# Patient Record
Sex: Male | Born: 1965 | Race: Black or African American | Hispanic: No | Smoking: Former smoker
Health system: Southern US, Community
[De-identification: ages and names within clinical notes are randomized; demographics above are authoritative.]

## PROBLEM LIST (undated history)

## (undated) DIAGNOSIS — E119 Type 2 diabetes mellitus without complications: Secondary | ICD-10-CM

## (undated) DIAGNOSIS — Z0000 Encounter for general adult medical examination without abnormal findings: Principal | ICD-10-CM

## (undated) DIAGNOSIS — R519 Headache, unspecified: Secondary | ICD-10-CM

## (undated) DIAGNOSIS — K7689 Other specified diseases of liver: Secondary | ICD-10-CM

## (undated) DIAGNOSIS — E042 Nontoxic multinodular goiter: Secondary | ICD-10-CM

## (undated) DIAGNOSIS — H269 Unspecified cataract: Secondary | ICD-10-CM

## (undated) DIAGNOSIS — K769 Liver disease, unspecified: Secondary | ICD-10-CM

## (undated) DIAGNOSIS — E785 Hyperlipidemia, unspecified: Secondary | ICD-10-CM

## (undated) HISTORY — DX: Nontoxic multinodular goiter: E04.2

## (undated) HISTORY — DX: Encounter for general adult medical examination without abnormal findings: Z00.00

## (undated) HISTORY — DX: Unspecified cataract: H26.9

## (undated) HISTORY — PX: WISDOM TOOTH EXTRACTION: SHX21

## (undated) HISTORY — DX: Other specified diseases of liver: K76.89

## (undated) HISTORY — DX: Hyperlipidemia, unspecified: E78.5

## (undated) HISTORY — DX: Type 2 diabetes mellitus without complications: E11.9

## (undated) HISTORY — PX: EYE SURGERY: SHX253

## (undated) HISTORY — DX: Liver disease, unspecified: K76.9

---

## 2004-12-17 ENCOUNTER — Ambulatory Visit: Payer: Self-pay | Admitting: Internal Medicine

## 2005-05-14 ENCOUNTER — Ambulatory Visit: Payer: Self-pay | Admitting: Internal Medicine

## 2005-05-21 ENCOUNTER — Ambulatory Visit: Payer: Self-pay

## 2006-01-06 ENCOUNTER — Ambulatory Visit: Payer: Self-pay | Admitting: Internal Medicine

## 2007-03-24 ENCOUNTER — Ambulatory Visit: Payer: Self-pay | Admitting: Internal Medicine

## 2007-03-24 LAB — CONVERTED CEMR LAB
Cholesterol: 196 mg/dL (ref 0–200)
HDL: 52.3 mg/dL (ref >39.0)
Microalb Creat Ratio: 40.6 mg/g — ABNORMAL HIGH (ref 0.0–30.0)
Potassium: 3.6 meq/L (ref 3.5–5.1)
VLDL: 11 mg/dL (ref 0–40)

## 2007-05-01 ENCOUNTER — Ambulatory Visit: Payer: Self-pay | Admitting: Endocrinology

## 2007-05-01 LAB — CONVERTED CEMR LAB: TSH: 1.39 microintl units/mL (ref 0.35–5.50)

## 2007-06-01 ENCOUNTER — Ambulatory Visit: Payer: Self-pay | Admitting: Endocrinology

## 2007-06-06 ENCOUNTER — Encounter: Admission: RE | Admit: 2007-06-06 | Discharge: 2007-06-06 | Payer: Self-pay | Admitting: Endocrinology

## 2007-07-24 ENCOUNTER — Ambulatory Visit: Payer: Self-pay | Admitting: Endocrinology

## 2007-09-13 ENCOUNTER — Encounter: Payer: Self-pay | Admitting: *Deleted

## 2007-09-13 DIAGNOSIS — E119 Type 2 diabetes mellitus without complications: Secondary | ICD-10-CM

## 2007-09-13 HISTORY — DX: Type 2 diabetes mellitus without complications: E11.9

## 2007-09-22 ENCOUNTER — Encounter: Payer: Self-pay | Admitting: Endocrinology

## 2007-09-22 ENCOUNTER — Ambulatory Visit: Payer: Self-pay | Admitting: Endocrinology

## 2007-09-22 DIAGNOSIS — K769 Liver disease, unspecified: Secondary | ICD-10-CM | POA: Insufficient documentation

## 2007-09-22 DIAGNOSIS — E042 Nontoxic multinodular goiter: Secondary | ICD-10-CM

## 2007-09-22 HISTORY — DX: Liver disease, unspecified: K76.9

## 2007-09-22 HISTORY — DX: Nontoxic multinodular goiter: E04.2

## 2007-09-22 LAB — CONVERTED CEMR LAB
ALT: 28 units/L (ref 0–53)
AST: 27 units/L (ref 0–37)
Total Bilirubin: 0.8 mg/dL (ref 0.3–1.2)
Total Protein: 7.2 g/dL (ref 6.0–8.3)

## 2008-04-12 ENCOUNTER — Ambulatory Visit: Payer: Self-pay | Admitting: Endocrinology

## 2008-04-12 DIAGNOSIS — B351 Tinea unguium: Secondary | ICD-10-CM | POA: Insufficient documentation

## 2008-04-12 DIAGNOSIS — R609 Edema, unspecified: Secondary | ICD-10-CM

## 2008-04-12 LAB — CONVERTED CEMR LAB
AST: 20 units/L (ref 0–37)
Albumin: 4 g/dL (ref 3.5–5.2)
Bilirubin, Direct: 0.1 mg/dL (ref 0.0–0.3)
Hgb A1c MFr Bld: 8.3 % — ABNORMAL HIGH (ref 4.6–6.0)
Total Bilirubin: 0.8 mg/dL (ref 0.3–1.2)
Total Protein: 7.3 g/dL (ref 6.0–8.3)

## 2008-10-29 ENCOUNTER — Ambulatory Visit: Payer: Self-pay | Admitting: Endocrinology

## 2008-10-29 DIAGNOSIS — K7689 Other specified diseases of liver: Secondary | ICD-10-CM | POA: Insufficient documentation

## 2008-10-29 HISTORY — DX: Other specified diseases of liver: K76.89

## 2008-10-29 LAB — CONVERTED CEMR LAB
ALT: 63 units/L — ABNORMAL HIGH (ref 0–53)
AST: 54 units/L — ABNORMAL HIGH (ref 0–37)
Bilirubin, Direct: 0.1 mg/dL (ref 0.0–0.3)
Hgb A1c MFr Bld: 10.2 % — ABNORMAL HIGH (ref 4.6–6.0)
Total Bilirubin: 1.3 mg/dL — ABNORMAL HIGH (ref 0.3–1.2)

## 2009-01-27 ENCOUNTER — Ambulatory Visit: Payer: Self-pay | Admitting: Endocrinology

## 2009-01-27 DIAGNOSIS — R071 Chest pain on breathing: Secondary | ICD-10-CM | POA: Insufficient documentation

## 2009-01-27 LAB — CONVERTED CEMR LAB
AST: 20 units/L (ref 0–37)
Albumin: 3.7 g/dL (ref 3.5–5.2)
Bilirubin, Direct: 0.1 mg/dL (ref 0.0–0.3)

## 2009-12-11 ENCOUNTER — Telehealth (INDEPENDENT_AMBULATORY_CARE_PROVIDER_SITE_OTHER): Payer: Self-pay | Admitting: *Deleted

## 2009-12-26 ENCOUNTER — Ambulatory Visit: Payer: Self-pay | Admitting: Endocrinology

## 2010-01-07 ENCOUNTER — Telehealth: Payer: Self-pay | Admitting: Endocrinology

## 2010-02-15 ENCOUNTER — Emergency Department (HOSPITAL_COMMUNITY): Admission: EM | Admit: 2010-02-15 | Discharge: 2010-02-15 | Payer: Self-pay | Admitting: Emergency Medicine

## 2010-02-26 ENCOUNTER — Encounter: Admission: RE | Admit: 2010-02-26 | Discharge: 2010-02-26 | Payer: Self-pay | Admitting: Family Medicine

## 2010-04-03 ENCOUNTER — Telehealth: Payer: Self-pay | Admitting: Endocrinology

## 2010-08-25 ENCOUNTER — Encounter: Payer: Self-pay | Admitting: Endocrinology

## 2011-01-15 ENCOUNTER — Encounter: Payer: Self-pay | Admitting: Endocrinology

## 2011-01-15 ENCOUNTER — Other Ambulatory Visit: Payer: Self-pay | Admitting: Endocrinology

## 2011-01-15 ENCOUNTER — Ambulatory Visit
Admission: RE | Admit: 2011-01-15 | Discharge: 2011-01-15 | Payer: Self-pay | Source: Home / Self Care | Attending: Endocrinology | Admitting: Endocrinology

## 2011-01-15 DIAGNOSIS — E7800 Pure hypercholesterolemia, unspecified: Secondary | ICD-10-CM | POA: Insufficient documentation

## 2011-01-15 DIAGNOSIS — M25539 Pain in unspecified wrist: Secondary | ICD-10-CM | POA: Insufficient documentation

## 2011-01-15 LAB — HEPATIC FUNCTION PANEL
ALT: 22 U/L (ref 0–53)
AST: 23 U/L (ref 0–37)
Alkaline Phosphatase: 68 U/L (ref 39–117)

## 2011-01-15 LAB — BASIC METABOLIC PANEL
BUN: 13 mg/dL (ref 6–23)
CO2: 30 mEq/L (ref 19–32)
Creatinine, Ser: 0.8 mg/dL (ref 0.4–1.5)
GFR: 134.12 mL/min (ref >60.00)

## 2011-01-15 LAB — LIPID PANEL
Cholesterol: 223 mg/dL — ABNORMAL HIGH (ref 0–200)
HDL: 62.8 mg/dL (ref >39.00)
Total CHOL/HDL Ratio: 4
Triglycerides: 58 mg/dL (ref 0.0–149.0)

## 2011-01-15 LAB — SEDIMENTATION RATE: Sed Rate: 13 mm/hr (ref 0–22)

## 2011-01-15 LAB — LDL CHOLESTEROL, DIRECT: Direct LDL: 153.5 mg/dL

## 2011-01-15 LAB — MICROALBUMIN / CREATININE URINE RATIO: Microalb Creat Ratio: 7.1 mg/g (ref 0.0–30.0)

## 2011-01-19 NOTE — Progress Notes (Signed)
Summary: Meter  Phone Note Call from Patient Call back at Home Phone [phone]   Caller: Patient Call For: Dr. Romero Belling Reason for Call: Talk to Doctor Summary of Call: Patient came into the office to let Dr. Everardo All know what type of meter he has. He said Dr. Everardo All wanted to know for insurance purposes. He has a Bionime Rightest [id number].  Initial call taken by: Irma Newness,  January 07, 2010 11:38 AM  Follow-up for Phone Call        please call patient: i sent rx for strips Follow-up by: Minus Breeding MD,  January 11, 2010 1:20 PM  Additional Follow-up for Phone Call Additional follow up Details #1::        left message on machine for pt to return my call  Additional Follow-up by: Margaret Pyle, CMA,  January 12, 2010 8:08 AM    Additional Follow-up for Phone Call Additional follow up Details #2::    left message on machine informing pt Follow-up by: Margaret Pyle, CMA,  January 12, 2010 2:33 PM  New/Updated Medications: RIGHTEST GS300 BLOOD GLUCOSE  STRP (GLUCOSE BLOOD) two times a day, and lancets 250.00 Prescriptions: RIGHTEST GS300 BLOOD GLUCOSE  STRP (GLUCOSE BLOOD) two times a day, and lancets 250.00  #100 x 11   Entered and Authorized by:   Minus Breeding MD   Signed by:   Minus Breeding MD on 01/11/2010   Method used:   Electronically to        [id number]* (retail)       [address] [place], [place]  [address]       Ph: [phone]       Fax: [phone]   RxID:   [id number]

## 2011-01-19 NOTE — Medication Information (Signed)
Summary: Nonadherence/[facility]  Nonadherence/[facility]   Imported By: Lester Webb 08/31/2010 09:41:23  _____________________________________________________________________  External Attachment:    Type:   Image     Comment:   External Document

## 2011-01-19 NOTE — Progress Notes (Signed)
Summary: ov needed  Phone Note Outgoing Call   Summary of Call: Left a message for pt to return my call. MD states Follow up is overdue. Initial call taken by: Josph Macho RMA,  April 03, 2010 9:17 AM  Follow-up for Phone Call        left message on machine to call back to office. Follow-up by: Lucious Groves,  Apr 21, 2010 2:54 PM  Additional Follow-up for Phone Call Additional follow up Details #1::        pt informed via VM Additional Follow-up by: Margaret Pyle, CMA,  Apr 24, 2010 11:14 AM

## 2011-01-19 NOTE — Assessment & Plan Note (Signed)
Summary: elev sugar/cd   Vital Signs:  Patient profile:   45 year old male Height:      72 inches (182.88 cm) Weight:      204.25 pounds (92.84 kg) BMI:     27.80 O2 Sat:      97 % on Room air Temp:     97.3 degrees F (36.28 degrees C) oral Pulse rate:   65 / minute BP sitting:   118 / 86  (left arm) Cuff size:   large  Vitals Entered By: Josph Macho CMA (December 26, 2009 3:43 PM)  O2 Flow:  Room air CC: Elevated Sugar- pt states he has been out of Glimepiride X2weeks/ pt states he is no longer taking Januvia/ pt states he is not aware of being allergic to Actos?/CF Is Patient Diabetic? Yes   Primary Provider:  hopper  CC:  Elevated Sugar- pt states he has been out of Glimepiride X2weeks/ pt states he is no longer taking Januvia/ pt states he is not aware of being allergic to Actos?/CF.  History of Present Illness: see above.  pt says he has not recently been on [place], but ran out of the amaryl.  no cbg record, but states cbg's are 200's.  when he was on the amaryl, cbg's were approx 140.  Current Medications (verified): 1)  Metformin Hcl 1000 Mg  Tabs (Metformin Hcl) .... Take 1 By Mouth Two Times A Day Qd 2)  Amaryl 2 Mg  Tabs (Glimepiride) .... Bid 3)  Januvia 100 Mg Tabs (Sitagliptin Phosphate) .... Qam 4)  Actos 45 Mg Tabs (Pioglitazone Hcl) .Marland Kitchen.. 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    Physical Exam  General:  normal appearance.   Pulses:  dorsalis pedis intact bilat. Extremities:  no deformity.  no ulcer on the feet.  feet are of normal color and temp.  no  edema.  there is onychomycosis on the right great toenail, and dry skin on the feet in general.  Neurologic:  sensation is intact to touch on the feet.    Impression & Recommendations:  Problem # 1:  DIABETES MELLITUS, TYPE II (ICD-250.00) needs increased rx  Medications Added to Medication List This Visit: 1)  Actos 45 Mg Tabs (Pioglitazone hcl) .Marland Kitchen.. 1 tab daily  Other Orders: Est. Patient Level III (41660)  Patient Instructions: 1)  resume amaryl 2)  continue actos and metformin. 3)  check your blood sugar 2 times a day.  vary the time of day when you check, between before the 3 meals, and at bedtime.  also check if you have symptoms of your blood sugar being too high or too low.  please keep a record of the readings and bring it to your next appointment here.  please call us sooner if you are having low blood sugar episodes. 4)  please call us with the brand of meter you use, so we can send in rx for your strips, so your insurance will pay. 5)  return 6 weeks. 6)  come in next week for physical labs, a1c 250.00, and microalb 250.00. 7)  physical with dr hopper soon. Prescriptions: ACTOS 45 MG TABS (PIOGLITAZONE HCL)  1 tab daily  #30 x 11   Entered and Authorized by:   Minus Breeding MD   Signed by:   Minus Breeding MD on 12/26/2009   Method used:   Electronically to        [id number]* (retail)       [address] [place], [place]  [address]       Ph: [phone]       Fax: [phone]   RxID:   [id number] AMARYL 2 MG  TABS (GLIMEPIRIDE) bid  #60.0 Each x 11   Entered and Authorized by:   Minus Breeding MD   Signed by:   Minus Breeding MD on 12/26/2009   Method used:   Electronically to        [facility] #[facility]* (retail)       [address] [place], [place]  [address]       Ph: [phone]       Fax: [phone]   RxID:   [id number] METFORMIN HCL 1000 MG  TABS (METFORMIN HCL) take 1 by mouth two times a day qd  #60.0  Each x 11   Entered and Authorized by:   Minus Breeding MD   Signed by:   Minus Breeding MD on 12/26/2009   Method used:   Electronically to        [facility]* (retail)       [address] [place], [place]  [address]       Ph: [phone]       Fax: [phone]   RxID:   [id number]

## 2011-01-27 NOTE — Assessment & Plan Note (Signed)
Summary: f/u on diabetes/#/cd   Vital Signs:  Patient profile:   45 year old male Height:      72 inches (182.88 cm) Weight:      203.50 pounds (92.50 kg) BMI:     27.70 O2 Sat:      96 % on Room air Temp:     98.0 degrees F (36.67 degrees C) oral Pulse rate:   74 / minute Pulse rhythm:   regular BP sitting:   100 / 74  (left arm) Cuff size:   large  Vitals Entered By: Brenton Grills CMA Duncan Dull) (January 15, 2011 8:06 AM)  O2 Flow:  Room air CC: Follow up on DM/refills on meds/pt is no longer taking Actos/aj Is Patient Diabetic? Yes   Primary Provider:  hopper  CC:  Follow up on DM/refills on meds/pt is no longer taking Actos/aj.  History of Present Illness: pt states few mos of moderate pain at both wrists, but no assoc numbness. no cbg record, but states cbg's vary widely.  he ran out of medication approx 1 week ago.    Current Medications (verified): 1)  Metformin Hcl 1000 Mg  Tabs (Metformin Hcl) .... Take 1 By Mouth Two Times A Day Qd 2)  Amaryl 2 Mg  Tabs (Glimepiride) .... Bid 3)  Actos 45 Mg Tabs (Pioglitazone Hcl) .Marland Kitchen.. 1 Tab Daily 4)  Rightest Gs300 Blood Glucose  Strp (Glucose Blood) .... 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 Family History Diabetes 1st degree relative Family History Hypertension  Social History: Reviewed history from 04/12/2008 and no changes required. 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    Physical  Exam  General:  normal appearance.   Extremities:  wrists and hands are normal to my exam Additional Exam:  Hemoglobin A1C       [H]  10.8 %                      4.6-6.5 Sed Rate                  13 mm/hr                    0-22 Cholesterol LDL - Direct                  153.5 mg/dL   Impression & Recommendations:  Problem # 1:  DIABETES MELLITUS, TYPE II (ICD-250.00) poor control  Problem # 2:  WRIST PAIN, BILATERAL (YQM-578.46) Assessment: New uncertain etiology  Problem # 3:  HYPERCHOLESTEROLEMIA (ICD-272.0) Assessment: New needs increased rx  Medications Added to Medication List This Visit: 1)  Metformin Hcl 1000 Mg Tabs (Metformin hcl) .... Take 1 by mouth two times a day 2)  Amaryl 2 Mg Tabs (Glimepiride) .... Two times a day  Other Orders: EKG w/ Interpretation (93000) Orthopedic Surgeon Referral (Ortho Surgeon) TLB-Lipid Panel (80061-LIPID) TLB-Hepatic/Liver Function Pnl (80076-HEPATIC) TLB-BMP (Basic Metabolic Panel-BMET) (80048-METABOL) TLB-TSH (Thyroid Stimulating Hormone) (84443-TSH) TLB-A1C / Hgb A1C (Glycohemoglobin) (83036-A1C) TLB-Microalbumin/Creat Ratio, Urine (82043-MALB) TLB-Sedimentation Rate (  ESR) (85652-ESR) Est. Patient Level IV (13244)  Patient Instructions: 1)  blood tests are being ordered for you today.  please call [phone] to hear your test results. 2)  refer to a hand specialist.  you will be called with a day and time for an appointment. 3)  good diet and exercise habits significanly improve the control of your diabetes.  please let me know if you wish to be referred to a dietician.  high blood sugar is very risky to your health.  you should see an eye doctor every year. 4)  controlling your blood pressure and cholesterol drastically reduces the damage diabetes does to your body.  this also applies to quitting smoking.  please discuss these with your doctor.  you should take an aspirin every day, unless you have been advised by a doctor not  to. 5)  check your blood sugar 1 time a day.  vary the time of day when you check, between before the 3 meals, and at bedtime.  also check if you have symptoms of your blood sugar being too high or too low.  please keep a record of the readings and bring it to your next appointment here.  please call us sooner if you are having low blood sugar episodes. 6)  Please schedule a follow-up appointment in 2-3 weeks. 7)  (update: i left message on phone-tree:  rx as we discussed.  also, you should consider chol med). Prescriptions: ACTOS 45 MG TABS (PIOGLITAZONE HCL) 1 tab daily  #30 x 2   Entered and Authorized by:   Minus Breeding MD   Signed by:   Minus Breeding MD on 01/15/2011   Method used:   Electronically to        [facility] #[facility]* (retail)       [address] [place], [place]  [address]       Ph: [phone]       Fax: [phone]   RxID:   [id number] AMARYL 2 MG  TABS (GLIMEPIRIDE) two times a day  #60 x 2   Entered and Authorized by:   Minus Breeding MD   Signed by:   Minus Breeding MD on 01/15/2011   Method used:   Electronically to        [facility] #[facility]* (retail)       [address] [place], [place]  [address]       Ph: [phone]       Fax: [phone]   RxID:   [id number] METFORMIN HCL 1000 MG  TABS (METFORMIN HCL) take 1 by mouth two times a day  #60 x 2   Entered and Authorized by:   Minus Breeding MD   Signed by:   Minus Breeding MD on 01/15/2011   Method used:   Electronically to        [facility] #[facility]* (retail)       [address] [place], [place]  [address]       Ph: [phone]       Fax: [phone]   RxID:   [id number]    Orders Added: 1)  EKG w/ Interpretation [93000] 2)  Orthopedic Surgeon Referral [Ortho Surgeon] 3)  TLB-Lipid Panel [80061-LIPID] 4)  TLB-Hepatic/Liver Function Pnl [80076-HEPATIC] 5)  TLB-BMP (Basic Metabolic Panel-BMET) [80048-METABOL] 6)  TLB-TSH (Thyroid  Stimulating  Hormone) [84443-TSH] 7)  TLB-A1C / Hgb A1C (Glycohemoglobin) [83036-A1C] 8)  TLB-Microalbumin/Creat Ratio, Urine [82043-MALB] 9)  TLB-Sedimentation Rate (ESR) [85652-ESR] 10)  Est. Patient Level IV [04540]

## 2011-01-28 ENCOUNTER — Encounter: Payer: Self-pay | Admitting: Endocrinology

## 2011-02-05 ENCOUNTER — Ambulatory Visit: Payer: Self-pay | Admitting: Endocrinology

## 2011-02-10 NOTE — Letter (Signed)
Summary: [facility]   Imported By: Sherian Rein 02/05/2011 09:50:05  _____________________________________________________________________  External Attachment:    Type:   Image     Comment:   External Document

## 2011-02-19 ENCOUNTER — Encounter: Payer: Self-pay | Admitting: Endocrinology

## 2011-02-19 ENCOUNTER — Ambulatory Visit (INDEPENDENT_AMBULATORY_CARE_PROVIDER_SITE_OTHER): Payer: BC Managed Care – PPO | Admitting: Endocrinology

## 2011-02-19 DIAGNOSIS — E119 Type 2 diabetes mellitus without complications: Secondary | ICD-10-CM

## 2011-03-02 NOTE — Assessment & Plan Note (Signed)
Summary: 2-3 WK FU  STC   Vital Signs:  Patient profile:   45 year old male Height:      72 inches (182.88 cm) Weight:      204.38 pounds (92.90 kg) BMI:     27.82 O2 Sat:      96 % on Room air Temp:     98.3 degrees F (36.83 degrees C) oral Pulse rate:   80 / minute Pulse rhythm:   regular BP sitting:   108 / 72  (left arm) Cuff size:   large  Vitals Entered By: Brenton Grills CMA Duncan Dull) (February 19, 2011 8:04 AM)  O2 Flow:  Room air CC: 3 week F/U/aj Is Patient Diabetic? Yes   Primary Provider:  hopper  CC:  3 week F/U/aj.  History of Present Illness: the status of at least 3 ongoing medical problems is addressed today: dm: pt is not taking actos.  no cbg record, but states cbg's are "high-100's."  he ran out of the other 2 dm meds for a while, but he is back on them now.  he says his exercise is better recently, and this helps cbg's.   nash: has been noticed in the past. actos would help this.  pt is hesitant about taking actos.  i advised him that, other than edema, side-effects are rare, and that actos would help nash. onychomycosis:  pt wants rx, as he says this is unchanged.  Current Medications (verified): 1)  Metformin Hcl 1000 Mg  Tabs (Metformin Hcl) .... Take 1 By Mouth Two Times A Day 2)  Amaryl 2 Mg  Tabs (Glimepiride) .... Two Times A Day 3)  Actos 45 Mg Tabs (Pioglitazone Hcl) .Marland Kitchen.. 1 Tab Daily 4)  Rightest Gs300 Blood Glucose  Strp (Glucose Blood) .... 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    Physical Exam  General:  normal appearance.   Pulses:  dorsalis pedis intact bilat. Extremities:  no deformity.  no ulcer on the feet.  feet are of normal color and temp.  no edema mycotic toenails.   Neurologic:  sensation is intact to touch on the feet.    Impression & Recommendations:  Problem # 1:  DIABETES MELLITUS, TYPE II (ICD-250.00) therapy limited by noncompliance.  i'll do the best i can.  Problem # 2:  FATTY LIVER DISEASE (ICD-571.8) this limits rx of #3  Problem # 3:  ONYCHOMYCOSIS (ICD-110.1) Assessment: Unchanged  Medications Added to Medication List This Visit: 1)  Freestyle Lite Test Strp (Glucose blood) .... Once daily, and lancets 250.00  Other Orders: Est. Patient Level IV (04540)  Patient Instructions: 1)  check your blood sugar 1 time a day.  vary the time of day when you check, between before the 3 meals, and at bedtime.  also check if you have symptoms of your blood sugar being too high or too low.  please keep a record of the readings and bring it to your next appointment  here.  please call us sooner if you are having low blood sugar episodes. 2)  you should take three diabetes medications.   3)  here are 2 new meters.  i have sent a prescription for strips to your pharmacy. 4)  Please schedule a follow-up appointment in 3 months. 5)  you should consider cholesterol medication.  let me know if you want a prescription.  we can consider medication for the toenail fungus when your liver is better. Prescriptions: FREESTYLE LITE TEST  STRP (GLUCOSE BLOOD) once daily, and lancets 250.00  #100 x 11   Entered and Authorized by:   Minus Breeding MD   Signed by:   Minus Breeding MD on 02/19/2011   Method used:   Electronically to        [facility]* (retail)       [address] [place], [place]  [address]       Ph: [phone]       Fax: [phone]   RxID:   [id number] ACTOS 45 MG TABS (PIOGLITAZONE HCL) 1 tab daily  #30 x 11   Entered and Authorized by:   Minus Breeding MD   Signed by:   Minus Breeding MD on 02/19/2011   Method used:   Electronically to        [facility] #[facility]* (retail)       [address] [place], [place]  [address]       Ph: [phone]       Fax: [phone]   RxID:   [id number] AMARYL 2 MG  TABS (GLIMEPIRIDE) two times a day  #60 x 11   Entered and Authorized by:   Minus Breeding MD   Signed by:   Minus Breeding MD on 02/19/2011   Method used:   Electronically to        [facility]* (retail)       [address] [place], [place]  [address]       Ph: [phone]       Fax: [phone]   RxID:   [id number] METFORMIN HCL 1000 MG  TABS (METFORMIN HCL) take 1 by mouth two times a day  #60 x 11   Entered and Authorized by:   Minus Breeding MD   Signed by:   Minus Breeding MD on 02/19/2011   Method used:   Electronically to        [facility] #94[facility]* (retail)       [address] [place], [place]  [address]       Ph: [phone]       Fax: [phone]   RxID:   [id number]    Orders Added: 1)  Est. Patient Level IV [82423]

## 2011-05-04 NOTE — Consult Note (Signed)
[facility]                          ENDOCRINOLOGY CONSULTATION   Kenneth Durham, Kenneth Durham                   MRN:          [id number]  DATE:05/01/2007                            DOB:          Mar 09, 1966    REFERRING PHYSICIAN:  Titus Dubin. Alwyn Ren, MD,FACP,FCCP   REASON FOR REFERRAL:  Diabetes.   HISTORY OF PRESENT ILLNESS:  This is a 45 year old man who reports a  three year history of type 2 diabetes.  He is not aware of any chronic  complications.  He states it was well-controlled until his glucoses  began going up about a year ago.  He states most of his glucoses are in  the 100's, but often, they are above 200 as well.  He states his diet  and exercise are not good.  One year of moderate weight gain of 20  pounds, but no associated numbness of his feet.   PAST MEDICAL HISTORY:  Otherwise, healthy.   MEDICATIONS:  1. Metformin 1000 mg twice a day.  2. Amaryl 4 mg daily.   SOCIAL HISTORY:  He is married.  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   FAMILY HISTORY:  Positive for diabetes in his father.   REVIEW OF SYSTEMS:  Denies chest pain, syncope and hypoglycemia.   PHYSICAL EXAMINATION:  VITAL SIGNS:  Blood pressure 114/79, heart rate  87, temperature 98.9, weight 222.  GENERAL:  Obese, no distress.  SKIN:  Not diaphoretic.  No rash.  HEENT:  No proptosis.  No periorbital swelling.  NECK:  He has a 2 or 3 times normal size, multinodular goiter.  CHEST:  Clear to auscultation.  No respiratory distress.  CARDIOVASCULAR:  No edema.  Regular rate and rhythm.  No murmur.  Pedal  pulses are intact.  ABDOMEN:  Soft, nontender.  No hepatosplenomegaly.  No mass.  EXTREMITIES:  Feet:  He has onychomycosis.  Feet are otherwise of normal  color and temperature.  There is no ulcer present in the feet.  NEUROLOGICAL:  Alert, well-oriented, does not appear anxious nor  depressed.  Sensation is intact to touch on the feet.   LABORATORY DATA:  Forwarded by Dr. Alwyn Ren.  On March 24, 2007, urine  microalbumin is positive.  A1C is 9.4.  ALT is 47.  LDL cholesterol 132.   IMPRESSION:  1. I agree with Dr. 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  2. Incidentally noted multinodular goiter.  3. Weight gain which does not portend well for his diabetic control.  4. Diabetes nephropathy.  5. Elevated hepatic transaminase, most commonly due to NASH.   PLAN:  1. We discussed the importance of diet and exercise therapy and      discussed the risk of diabetes.  2. Check TSH and thyroid ultrasound .  3. Add Actos 45 mg daily.  I told him I will do the prior      authorization  if necessary.  4. Refer to dietician.  5. Return in 30 days.  6. Please continue your follow up with Dr. Alwyn Ren and continue      aggressive therapy of your other risk factors, as control of both      cholesterol and hypertension have both been found to reduce urine      microalbumin excretion.     Sean A. Everardo All, MD  Electronically Signed    SA[facility]/MedQ  DD: 05/02/2007  DT: 05/03/2007  Job #: [id number]   cc:   Titus Dubin. Alwyn Ren, MD,FACP,FCCP

## 2011-05-07 NOTE — Assessment & Plan Note (Signed)
[facility]                        [facility] OFFICE NOTE   Kenneth Durham, Kenneth Durham                   MRN:          [id number]  DATE:03/24/2007                            DOB:          09/18/1966    Mr. Cavan was seen March 24, 2007 for refill of his diabetic  medications.  He feels his sugars are averaging approximately 200.  The  fasting blood sugars have been in the range of 180 with 2 hours after a  meal up to 260 or greater.   He rarely will have atypical left chest pain.  He has no chest pain  despite exercise on a treadmill for 25-30 minutes at a high level 3-4  times a week.  He has no other cardiopulmonary symptoms.   He denies polyuria, polydipsia, polyphagia.  He has had no paresthesias.  He has no nonhealing skin lesions.   There is no new addition to his history.  Last hospitalization was for  dehydration in 1995.   He has not smoked since 1993.   He is presently on glimepiride 4 mg each morning, metformin 1000 mg  twice a day, and enteric-coated aspirin.   He had been prescribed Vytorin but elected not to continue it.  He  states he is only eating 2 meals a day.   He is overdue for an eye exam.   Weight is up a little over 15 pounds to 228, pulse was 64, respiratory  rate 12, and blood pressure is excellent at 90/70.  CHEST:  Clear.  He has no murmurs or gallops.  All pulses are intact.  There is no aortic aneurysm.  He has no organomegaly.  SKIN:  Reveals no lesions.   The medications will be renewed.  Fasting labs will collected and sent  to him.   A goal sheet will also be provided.  If the lipids are still elevated,  then pravastatin would be an option.     Titus Dubin. Alwyn Ren, MD,FACP,FCCP  Electronically Signed    [facility]/MedQ  DD: 03/24/2007  DT: 03/24/2007  Job #: [id number]

## 2011-05-21 ENCOUNTER — Ambulatory Visit: Payer: BC Managed Care – PPO | Admitting: Endocrinology

## 2011-05-21 DIAGNOSIS — Z0289 Encounter for other administrative examinations: Secondary | ICD-10-CM

## 2011-05-21 NOTE — Progress Notes (Unsigned)
  Subjective:    Patient ID: Kenneth Durham, male    DOB: [birth year], 45 y.o.   MRN: [id number]  HPI    Review of Systems     Objective:   Physical Exam        Assessment & Plan:

## 2011-12-31 ENCOUNTER — Ambulatory Visit: Payer: Self-pay

## 2012-02-11 ENCOUNTER — Other Ambulatory Visit (INDEPENDENT_AMBULATORY_CARE_PROVIDER_SITE_OTHER): Payer: BC Managed Care – PPO

## 2012-02-11 ENCOUNTER — Encounter: Payer: Self-pay | Admitting: Endocrinology

## 2012-02-11 ENCOUNTER — Ambulatory Visit (INDEPENDENT_AMBULATORY_CARE_PROVIDER_SITE_OTHER): Payer: BC Managed Care – PPO | Admitting: Endocrinology

## 2012-02-11 DIAGNOSIS — E119 Type 2 diabetes mellitus without complications: Secondary | ICD-10-CM

## 2012-02-11 DIAGNOSIS — K769 Liver disease, unspecified: Secondary | ICD-10-CM

## 2012-02-11 DIAGNOSIS — B351 Tinea unguium: Secondary | ICD-10-CM

## 2012-02-11 LAB — HEMOGLOBIN A1C: Hgb A1c MFr Bld: 10.7 % — ABNORMAL HIGH (ref 4.6–6.5)

## 2012-02-11 LAB — HEPATIC FUNCTION PANEL: Total Protein: 7 g/dL (ref 6.0–8.3)

## 2012-02-11 MED ORDER — TERBINAFINE HCL 250 MG PO TABS: 250.0000 mg | ORAL_TABLET | Freq: Every day | ORAL | Status: DC

## 2012-02-11 MED ORDER — PIOGLITAZONE HCL 45 MG PO TABS: 45.0000 mg | ORAL_TABLET | Freq: Every day | ORAL | Status: DC

## 2012-02-11 NOTE — Progress Notes (Signed)
  Subjective:    Patient ID: Kenneth Durham, male    DOB: Sep 06, 1966, 46 y.o.   MRN: [id number]  HPI Pt returns for f/u of type 2 DM (2005).  no cbg record, but states cbg's are 100-200.  There is no trend throughout the day.  pt states he feels well in general. Pt states many years of severe thickening of the toenails, but no assoc pain. Past Medical History  Diagnosis Date  . GOITER, NONTOXIC MULTINODULAR 09/22/2007  . DIABETES MELLITUS, TYPE II 09/13/2007  . FATTY LIVER DISEASE 10/29/2008  . 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 HYPERCHOLESTEROLEMIA 01/15/2011    No past surgical history on file.  History   Social History  . Marital Status: Married    Spouse Name: N/A    Number of Children: N/A  . Years of Education: N/A   Occupational History  . 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 Smoking status: Former Games developer  . Smokeless tobacco: Not on file  . Alcohol Use: Not on file  . Drug Use: Not on file  . Sexually Active: Not on file   Other Topics Concern  . Not on file   Social History Narrative  . No narrative on file    Current Outpatient Prescriptions on File Prior to Visit  Medication Sig Dispense Refill  . glimepiride (AMARYL) 2 MG tablet Take 2 mg by mouth 2 (two) times daily.        Marland Kitchen glucose blood (FREESTYLE LITE) test strip Use as instructed once daily dx 250.00       . metFORMIN (GLUCOPHAGE) 1000 MG tablet Take 1,000 mg by mouth 2 (two) times daily with a meal.          No Known Allergies  Family History  Problem Relation Age of Onset  . Diabetes Other     1st degree relative  . Hypertension Other   . Heart disease Other     FH of Cardiac Murmur  . Cancer Other     Colon Cancer    BP 122/74  Pulse 69  Temp(Src) 97.3 F (36.3 C) (Oral)  Ht 6' (1.829 m)  Wt 214 lb (97.07 kg)  BMI 29.02 kg/m2  SpO2 98%    Review of Systems denies hypoglycemia and weight change.      Objective:    Physical Exam VITAL SIGNS:  See vs page GENERAL: no distress Pulses: dorsalis pedis intact bilat.   Feet: no deformity.  no ulcer on the feet.  feet are of normal color and temp.  no edema.  There is severe bilateral onychomycosis.  Neuro: sensation is intact to touch on the feet.  Lab Results  Component Value Date   HGBA1C 10.7* 02/11/2012      Assessment & Plan:  DM, needs increased rx.   Onychomycosis, persistent NASH, improved

## 2012-02-11 NOTE — Patient Instructions (Addendum)
blood tests are being requested for you today.  please call [phone] to hear your test results.  You will be prompted to enter the 9-digit "MRN" number that appears at the top left of this page, followed by #.  Then you will hear the message. If the liver test is normal, i'll prescribe the medication to kill the toenail fungus.   If the a1c is high, i'll prescribe the actos to add-on. Please come back for a follow-up appointment in 3 months good diet and exercise habits significanly improve the control of your diabetes.  please let me know if you wish to be referred to a dietician.  high blood sugar is very risky to your health.  you should see an eye doctor every year. controlling your blood pressure and cholesterol drastically reduces the damage diabetes does to your body.  this also applies to quitting smoking.  please discuss these with your doctor.  you should take an aspirin every day, unless you have been advised by a doctor not to. check your blood sugar 1 time a day.  vary the time of day when you check, between before the 3 meals, and at bedtime.  also check if you have symptoms of your blood sugar being too high or too low.  please keep a record of the readings and bring it to your next appointment here.  please call us sooner if your blood sugar goes below 70, or if it stays over 200.   (update: i left message on phone-tree:  i sent rxs for actos and lamisil).

## 2012-03-11 ENCOUNTER — Other Ambulatory Visit: Payer: Self-pay | Admitting: Endocrinology

## 2012-05-12 ENCOUNTER — Ambulatory Visit: Payer: BC Managed Care – PPO | Admitting: Endocrinology

## 2012-06-12 ENCOUNTER — Encounter: Payer: Self-pay | Admitting: Endocrinology

## 2012-06-12 ENCOUNTER — Ambulatory Visit (INDEPENDENT_AMBULATORY_CARE_PROVIDER_SITE_OTHER): Payer: BC Managed Care – PPO | Admitting: Endocrinology

## 2012-06-12 VITALS — BP 108/70 | HR 88 | Temp 98.2°F | Ht 72.0 in | Wt 204.0 lb

## 2012-06-12 DIAGNOSIS — E119 Type 2 diabetes mellitus without complications: Secondary | ICD-10-CM

## 2012-06-12 MED ORDER — BROMOCRIPTINE MESYLATE 2.5 MG PO TABS: 2.5000 mg | ORAL_TABLET | Freq: Every day | ORAL | Status: DC

## 2012-06-12 NOTE — Patient Instructions (Addendum)
Please continue the 3 diabetes medications. Please add "bromocriptine," to help your blood sugar. It has possible side effects of nausea and dizziness.  These go away with time.  You can avoid these by taking it at bedtime, and by taking just take 1/2 pill for the first week.   Please come back for a follow-up appointment in 2 weeks.  check your blood sugar 1 time a day.  vary the time of day when you check, between before the 3 meals, and at bedtime.  also check if you have symptoms of your blood sugar being too high or too low.  please keep a record of the readings and bring it to your next appointment here.  please call us sooner if your blood sugar goes below 70, or if it stays over 200.

## 2012-06-12 NOTE — Progress Notes (Signed)
  Subjective:    Patient ID: Kenneth Durham, male    DOB: 1966/02/06, 46 y.o.   MRN: [id number]  HPI Pt returns for f/u of type 2 DM (dx'ed 2005; no known complications).  no cbg record, but states cbg's are 200-300.  There is no trend throughout the day.  pt states he feels well in general.  He just started actos 2 weeks ago.   Past Medical History  Diagnosis Date  . GOITER, NONTOXIC MULTINODULAR 09/22/2007  . DIABETES MELLITUS, TYPE II 09/13/2007  . FATTY LIVER DISEASE 10/29/2008  . 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 HYPERCHOLESTEROLEMIA 01/15/2011    No past surgical history on file.  History   Social History  . Marital Status: Married    Spouse Name: N/A    Number of Children: N/A  . Years of Education: N/A   Occupational History  . 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 Smoking status: Former Games developer  . Smokeless tobacco: Not on file  . Alcohol Use: Not on file  . Drug Use: Not on file  . Sexually Active: Not on file   Other Topics Concern  . Not on file   Social History Narrative  . No narrative on file    Current Outpatient Prescriptions on File Prior to Visit  Medication Sig Dispense Refill  . glimepiride (AMARYL) 2 MG tablet TAKE 1 TABLET BY MOUTH TWICE DAILY  60 tablet  10  . glucose blood (FREESTYLE LITE) test strip Use as instructed once daily dx 250.00       . metFORMIN (GLUCOPHAGE) 1000 MG tablet TAKE 1 TABLET BY MOUTH TWICE DAILY  60 tablet  10  . pioglitazone (ACTOS) 45 MG tablet Take 1 tablet (45 mg total) by mouth daily.  30 tablet  11  . bromocriptine (PARLODEL) 2.5 MG tablet Take 1 tablet (2.5 mg total) by mouth at bedtime.  30 tablet  11    No Known Allergies  Family History  Problem Relation Age of Onset  . Diabetes Other     1st degree relative  . Hypertension Other   . Heart disease Other     FH of Cardiac Murmur  . Cancer Other     Colon Cancer    BP 108/70  Pulse 88   Temp 98.2 F (36.8 C) (Oral)  Ht 6' (1.829 m)  Wt 204 lb (92.534 kg)  BMI 27.67 kg/m2  SpO2 98%    Review of Systems Denies weight change    Objective:   Physical Exam VITAL SIGNS:  See vs page GENERAL: no distress Ext: no edema       Assessment & Plan:  DM.  therapy limited by noncompliance.  i'll do the best i can.   He probably needs insulin, but he wants to avoid if possible.

## 2012-06-26 ENCOUNTER — Encounter: Payer: Self-pay | Admitting: Endocrinology

## 2012-06-26 ENCOUNTER — Ambulatory Visit (INDEPENDENT_AMBULATORY_CARE_PROVIDER_SITE_OTHER): Payer: BC Managed Care – PPO | Admitting: Endocrinology

## 2012-06-26 ENCOUNTER — Telehealth: Payer: Self-pay | Admitting: *Deleted

## 2012-06-26 ENCOUNTER — Other Ambulatory Visit (INDEPENDENT_AMBULATORY_CARE_PROVIDER_SITE_OTHER): Payer: BC Managed Care – PPO

## 2012-06-26 VITALS — BP 110/82 | HR 98 | Temp 97.3°F | Ht 72.0 in | Wt 203.0 lb

## 2012-06-26 DIAGNOSIS — E119 Type 2 diabetes mellitus without complications: Secondary | ICD-10-CM

## 2012-06-26 LAB — HEMOGLOBIN A1C: Hgb A1c MFr Bld: 12.4 % — ABNORMAL HIGH (ref 4.6–6.5)

## 2012-06-26 NOTE — Telephone Encounter (Signed)
Called pt to inform of lab results, left message for pt to callback office (letter also mailed to pt). 

## 2012-06-26 NOTE — Progress Notes (Signed)
  Subjective:    Patient ID: Kenneth Durham, male    DOB: Nov 08, 1966, 46 y.o.   MRN: [id number]  HPI Pt returns for f/u of type 2 DM (dx'ed 2005; no known complications).  He says the addition of parlodel did not help.  no cbg record, but states cbg's are in the 200's.  There is no trend throughout the day.  pt states he feels well in general.  Denies weight change.   Past Medical History  Diagnosis Date  . GOITER, NONTOXIC MULTINODULAR 09/22/2007  . DIABETES MELLITUS, TYPE II 09/13/2007  . FATTY LIVER DISEASE 10/29/2008  . 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 HYPERCHOLESTEROLEMIA 01/15/2011    No past surgical history on file.  History   Social History  . Marital Status: Married    Spouse Name: N/A    Number of Children: N/A  . Years of Education: N/A   Occupational History  . 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 Smoking status: Former Games developer  . Smokeless tobacco: Not on file  . Alcohol Use: Not on file  . Drug Use: Not on file  . Sexually Active: Not on file   Other Topics Concern  . Not on file   Social History Narrative  . No narrative on file    Current Outpatient Prescriptions on File Prior to Visit  Medication Sig Dispense Refill  . glimepiride (AMARYL) 2 MG tablet TAKE 1 TABLET BY MOUTH TWICE DAILY  60 tablet  10  . glucose blood (FREESTYLE LITE) test strip Use as instructed once daily dx 250.00       . Liraglutide (VICTOZA) 18 MG/3ML SOLN Inject 1.8 mg into the skin every morning.      . metFORMIN (GLUCOPHAGE) 1000 MG tablet TAKE 1 TABLET BY MOUTH TWICE DAILY  60 tablet  10  . pioglitazone (ACTOS) 45 MG tablet Take 1 tablet (45 mg total) by mouth daily.  30 tablet  11  . bromocriptine (PARLODEL) 2.5 MG tablet Take 1 tablet (2.5 mg total) by mouth at bedtime.  30 tablet  11    No Known Allergies  Family History  Problem Relation Age of Onset  . Diabetes Other     1st degree relative  .  Hypertension Other   . Heart disease Other     FH of Cardiac Murmur  . Cancer Other     Colon Cancer    BP 110/82  Pulse 98  Temp 97.3 F (36.3 C) (Oral)  Ht 6' (1.829 m)  Wt 203 lb (92.08 kg)  BMI 27.53 kg/m2  SpO2 97%  Review of Systems denies hypoglycemia    Objective:   Physical Exam VITAL SIGNS:  See vs page GENERAL: no distress Ext: no edema  Lab Results  Component Value Date   HGBA1C 12.4* 06/26/2012      Assessment & Plan:  DM.  It is very unlikely he can be controlled without insulin, but he wants to try

## 2012-06-26 NOTE — Patient Instructions (Addendum)
Please continue the 4 diabetes pills. Please come back for a follow-up appointment in 1 month. You should take "victoza" pen, once a day.  The side-effect is nausea, which goes away with time.  To avoid this side-effect, start with the lowest (0.6) setting.  After a few days, increase to 1.2.  If you still have little or no nausea, increase to the highest (1.8) setting, and continue that setting.  Here is a discount card.  This medication replaces the "januvia." check your blood sugar 1 time a day.  vary the time of day when you check, between before the 3 meals, and at bedtime.  also check if you have symptoms of your blood sugar being too high or too low.  please keep a record of the readings and bring it to your next appointment here.  please call us sooner if your blood sugar goes below 70, or if it stays over 200.   blood tests are being requested for you today.  You will receive a letter with results.

## 2012-06-27 NOTE — Telephone Encounter (Signed)
Left message for pt to callback office.  

## 2012-06-27 NOTE — Telephone Encounter (Signed)
It has no effect on the thyroid

## 2012-06-27 NOTE — Telephone Encounter (Signed)
Pt informed of lab results, pt wants to know if Victoza will affect his thyroid.

## 2012-06-28 NOTE — Telephone Encounter (Signed)
Pt informed of MD's advisement. 

## 2012-07-28 ENCOUNTER — Telehealth: Payer: Self-pay | Admitting: Endocrinology

## 2012-07-28 NOTE — Telephone Encounter (Signed)
Left message for pt to callback office.  

## 2012-07-28 NOTE — Telephone Encounter (Signed)
The pt called the triage line and is hoping to get a refill of medication.  I tried calling the patient back to clarify which medication (he did specify on the msg), but had to leave a voice mail msg.

## 2012-07-31 NOTE — Telephone Encounter (Signed)
Left message for pt to callback office.  

## 2012-08-01 NOTE — Telephone Encounter (Signed)
Left message for pt to callback office. No return call since 08/09, closing phone note.

## 2012-08-28 ENCOUNTER — Encounter: Payer: Self-pay | Admitting: Endocrinology

## 2012-08-28 ENCOUNTER — Other Ambulatory Visit (INDEPENDENT_AMBULATORY_CARE_PROVIDER_SITE_OTHER): Payer: BC Managed Care – PPO

## 2012-08-28 ENCOUNTER — Ambulatory Visit (INDEPENDENT_AMBULATORY_CARE_PROVIDER_SITE_OTHER): Payer: BC Managed Care – PPO | Admitting: Endocrinology

## 2012-08-28 VITALS — BP 122/82 | HR 76 | Temp 98.1°F | Ht 72.0 in | Wt 201.0 lb

## 2012-08-28 DIAGNOSIS — E119 Type 2 diabetes mellitus without complications: Secondary | ICD-10-CM

## 2012-08-28 NOTE — Patient Instructions (Addendum)
blood tests are being requested for you today.  You will receive a letter with results. check your blood sugar 1 time a day.  vary the time of day when you check, between before the 3 meals, and at bedtime.  also check if you have symptoms of your blood sugar being too high or too low.  please keep a record of the readings and bring it to your next appointment here.  please call us sooner if your blood sugar goes below 70, or if it stays over 200.   Please come back for a follow-up appointment in 3 months

## 2012-08-28 NOTE — Progress Notes (Signed)
  Subjective:    Patient ID: Kenneth Durham, male    DOB: [birth year], 46 y.o.   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  Pt says he does not take meds as rx'ed.  no cbg record, but states cbg's are well-controlled.   Past Medical History  Diagnosis Date  . GOITER, NONTOXIC MULTINODULAR 09/22/2007  . DIABETES MELLITUS, TYPE II 09/13/2007  . FATTY LIVER DISEASE 10/29/2008  . 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 HYPERCHOLESTEROLEMIA 01/15/2011    No past surgical history on file.  History   Social History  . Marital Status: Married    Spouse Name: N/A    Number of Children: N/A  . Years of Education: N/A   Occupational History  . 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 Smoking status: Former Games developer  . Smokeless tobacco: Not on file  . Alcohol Use: Not on file  . Drug Use: Not on file  . Sexually Active: Not on file   Other Topics Concern  . Not on file   Social History Narrative  . No narrative on file    Current Outpatient Prescriptions on File Prior to Visit  Medication Sig Dispense Refill  . bromocriptine (PARLODEL) 2.5 MG tablet Take 1 tablet (2.5 mg total) by mouth at bedtime.  30 tablet  11  . glimepiride (AMARYL) 2 MG tablet TAKE 1 TABLET BY MOUTH TWICE DAILY  60 tablet  10  . glucose blood (FREESTYLE LITE) test strip Use as instructed once daily dx 250.00       . Liraglutide (VICTOZA) 18 MG/3ML SOLN Inject 1.8 mg into the skin every morning.      . metFORMIN (GLUCOPHAGE) 1000 MG tablet TAKE 1 TABLET BY MOUTH TWICE DAILY  60 tablet  10  . pioglitazone (ACTOS) 45 MG tablet Take 1 tablet (45 mg total) by mouth daily.  30 tablet  11    No Known Allergies  Family History  Problem Relation Age of Onset  . Diabetes Other     1st degree relative  . Hypertension Other   . Heart disease Other     FH of  Cardiac Murmur  . Cancer Other     Colon Cancer    There were no vitals taken for this visit.  Review of Systems denies hypoglycemia    Objective:   Physical Exam VITAL SIGNS:  See vs page GENERAL: no distress Pulses: dorsalis pedis intact bilat.   Feet: no deformity.  no ulcer on the feet.  feet are of normal color and temp.  no edema.  There is severe bilateral onychomycosis.  Neuro: sensation is intact to touch on the feet.  Lab Results  Component Value Date   HGBA1C 8.2* 08/28/2012      Assessment & Plan:  DM, with improved control, prob due to improved compliance

## 2012-08-29 ENCOUNTER — Encounter: Payer: Self-pay | Admitting: Endocrinology

## 2012-08-31 ENCOUNTER — Telehealth: Payer: Self-pay | Admitting: *Deleted

## 2012-08-31 NOTE — Telephone Encounter (Signed)
Called pt to inform of lab results, left message for pt to callback office (letter also mailed to pt). 

## 2012-09-01 NOTE — Telephone Encounter (Signed)
Left message for pt to callback office.  

## 2012-09-04 NOTE — Telephone Encounter (Signed)
Left message for pt to callback office. Attempting to contact pt 3 separate occasions with no callback from pt, closing phone note.

## 2012-11-10 ENCOUNTER — Other Ambulatory Visit: Payer: Self-pay | Admitting: Endocrinology

## 2012-11-13 ENCOUNTER — Other Ambulatory Visit: Payer: Self-pay

## 2012-11-13 MED ORDER — LIRAGLUTIDE 18 MG/3ML ~~LOC~~ SOLN: 0.3000 mL | SUBCUTANEOUS | Status: DC

## 2012-11-27 ENCOUNTER — Ambulatory Visit: Payer: BC Managed Care – PPO | Admitting: Endocrinology

## 2013-01-01 ENCOUNTER — Encounter: Payer: Self-pay | Admitting: Endocrinology

## 2013-01-01 ENCOUNTER — Ambulatory Visit (INDEPENDENT_AMBULATORY_CARE_PROVIDER_SITE_OTHER): Payer: BC Managed Care – PPO | Admitting: Endocrinology

## 2013-01-01 VITALS — BP 140/80 | HR 80 | Temp 97.8°F | Wt 198.0 lb

## 2013-01-01 DIAGNOSIS — E119 Type 2 diabetes mellitus without complications: Secondary | ICD-10-CM

## 2013-01-01 DIAGNOSIS — E042 Nontoxic multinodular goiter: Secondary | ICD-10-CM

## 2013-01-01 LAB — MICROALBUMIN / CREATININE URINE RATIO
Creatinine,U: 231.4 mg/dL
Microalb Creat Ratio: 16.9 mg/g (ref 0.0–30.0)
Microalb, Ur: 39.1 mg/dL — ABNORMAL HIGH (ref 0.0–1.9)

## 2013-01-01 LAB — HEMOGLOBIN A1C: Hgb A1c MFr Bld: 7.8 % — ABNORMAL HIGH (ref 4.6–6.5)

## 2013-01-01 LAB — TSH: TSH: 0.32 u[IU]/mL — ABNORMAL LOW (ref 0.35–5.50)

## 2013-01-01 NOTE — Patient Instructions (Addendum)
blood tests are being requested for you today.  We'll contact you with results. check your blood sugar 1 time a day.  vary the time of day when you check, between before the 3 meals, and at bedtime.  also check if you have symptoms of your blood sugar being too high or too low.  please keep a record of the readings and bring it to your next appointment here.  please call us sooner if your blood sugar goes below 70, or if it stays over 200.   Please come back for a follow-up appointment in 3 months.   Please take only the medications listed below.  Let's recheck the thyroid ultrasound.  you will receive a phone call, about a day and time for an appointment

## 2013-01-01 NOTE — Progress Notes (Signed)
  Subjective:    Patient ID: Kenneth Durham, male    DOB: October 14, 1966, 47 y.o.   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  He continues to struggle with compliance with meds.  no cbg record, but states cbg's are well-controlled.  He says the glimepiride causes hypoglycemia.  He does not take actos nor parlodel.   Multinodular goiter was noted in 2008.  He was advised to schedule bx, but did not do so.  Past Medical History  Diagnosis Date  . GOITER, NONTOXIC MULTINODULAR 09/22/2007  . DIABETES MELLITUS, TYPE II 09/13/2007  . FATTY LIVER DISEASE 10/29/2008  . 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 HYPERCHOLESTEROLEMIA 01/15/2011    No past surgical history on file.  History   Social History  . Marital Status: Married    Spouse Name: N/A    Number of Children: N/A  . Years of Education: N/A   Occupational History  . 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 Smoking status: Former Games developer  . Smokeless tobacco: Not on file  . Alcohol Use: Not on file  . Drug Use: Not on file  . Sexually Active: Not on file   Other Topics Concern  . Not on file   Social History Narrative  . No narrative on file    Current Outpatient Prescriptions on File Prior to Visit  Medication Sig Dispense Refill  . bromocriptine (PARLODEL) 2.5 MG tablet Take 1 tablet (2.5 mg total) by mouth at bedtime.  30 tablet  11  . glucose blood (FREESTYLE LITE) test strip Use as instructed once daily dx 250.00       . Liraglutide (VICTOZA) 18 MG/3ML SOLN Inject 0.3 mLs (1.8 mg total) as directed 1 day or 1 dose. INJECT 0.3 ML UNDER THE SKIN EVERY DAY  9 mL  0  . metFORMIN (GLUCOPHAGE) 1000 MG tablet TAKE 1 TABLET BY MOUTH TWICE DAILY  60 tablet  10  . pioglitazone (ACTOS) 45 MG tablet Take 1 tablet (45 mg total) by mouth daily.  30 tablet  11    No Known  Allergies  Family History  Problem Relation Age of Onset  . Diabetes Other     1st degree relative  . Hypertension Other   . Heart disease Other     FH of Cardiac Murmur  . Cancer Other     Colon Cancer    BP 140/80  Pulse 80  Temp 97.8 F (36.6 C) (Oral)  Wt 198 lb (89.812 kg)  SpO2 98%  Review of Systems Denies LOC    Objective:   Physical Exam VITAL SIGNS:  See vs page GENERAL: no distress NECK: thyroid is 5x normal size, with multinodular surface    Lab Results  Component Value Date   TSH 0.32* 01/01/2013      Assessment & Plan:  DM: therapy limited by noncompliance.  i'll do the best i can. Multinodular goiter, with minimally suppressed TSH. elev BP, ? situational

## 2013-01-08 ENCOUNTER — Ambulatory Visit
Admission: RE | Admit: 2013-01-08 | Discharge: 2013-01-08 | Disposition: A | Discharge status: Home / Self Care | Payer: BC Managed Care – PPO | Source: Ambulatory Visit | Attending: Endocrinology | Admitting: Endocrinology

## 2013-01-08 DIAGNOSIS — E042 Nontoxic multinodular goiter: Secondary | ICD-10-CM

## 2013-02-10 ENCOUNTER — Other Ambulatory Visit: Payer: Self-pay | Admitting: Endocrinology

## 2013-03-25 ENCOUNTER — Other Ambulatory Visit: Payer: Self-pay | Admitting: Endocrinology

## 2013-03-26 ENCOUNTER — Other Ambulatory Visit: Payer: Self-pay | Admitting: *Deleted

## 2013-03-26 ENCOUNTER — Other Ambulatory Visit: Payer: Self-pay

## 2013-03-26 MED ORDER — LIRAGLUTIDE 18 MG/3ML ~~LOC~~ SOLN: 1.8000 mg | Freq: Every day | SUBCUTANEOUS | Status: DC

## 2013-03-28 ENCOUNTER — Other Ambulatory Visit: Payer: Self-pay | Admitting: *Deleted

## 2013-03-28 ENCOUNTER — Other Ambulatory Visit: Payer: Self-pay | Admitting: Endocrinology

## 2013-03-28 MED ORDER — METFORMIN HCL 1000 MG PO TABS: ORAL_TABLET | ORAL | Status: DC

## 2013-04-09 ENCOUNTER — Ambulatory Visit (INDEPENDENT_AMBULATORY_CARE_PROVIDER_SITE_OTHER): Payer: BC Managed Care – PPO | Admitting: Endocrinology

## 2013-04-09 VITALS — BP 126/74 | HR 100 | Wt 189.0 lb

## 2013-04-09 DIAGNOSIS — E119 Type 2 diabetes mellitus without complications: Secondary | ICD-10-CM

## 2013-04-09 DIAGNOSIS — E042 Nontoxic multinodular goiter: Secondary | ICD-10-CM

## 2013-04-09 DIAGNOSIS — E0590 Thyrotoxicosis, unspecified without thyrotoxic crisis or storm: Secondary | ICD-10-CM | POA: Insufficient documentation

## 2013-04-09 LAB — HEMOGLOBIN A1C: Hgb A1c MFr Bld: 10.1 % — ABNORMAL HIGH (ref 4.6–6.5)

## 2013-04-09 LAB — TSH: TSH: 0.27 u[IU]/mL — ABNORMAL LOW (ref 0.35–5.50)

## 2013-04-09 MED ORDER — CICLOPIROX 8 % EX SOLN: Freq: Every day | CUTANEOUS | Status: DC

## 2013-04-09 NOTE — Progress Notes (Signed)
Subjective:    Patient ID: Kenneth Durham, male    DOB: Mar 19, 1966, 47 y.o.   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  He continues to struggle with compliance with meds.  He takes actos, victoza, metformin, and glimepiride, but not the parlodel.  pt states he feels well in general.  no cbg record, but states cbg's are well-controlled. Multinodular goiter was noted in 2008.  He was noted in early 2014 for the first time, to have a suppressed TSH.   Onychomycosis of toenails persists.  He wants topical rx of this.  Past Medical History  Diagnosis Date  . GOITER, NONTOXIC MULTINODULAR 09/22/2007  . DIABETES MELLITUS, TYPE II 09/13/2007  . FATTY LIVER DISEASE 10/29/2008  . 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 HYPERCHOLESTEROLEMIA 01/15/2011    No past surgical history on file.  History   Social History  . Marital Status: Married    Spouse Name: N/A    Number of Children: N/A  . Years of Education: N/A   Occupational History  . 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 Smoking status: Former Games developer  . Smokeless tobacco: Not on file  . Alcohol Use: Not on file  . Drug Use: Not on file  . Sexually Active: Not on file   Other Topics Concern  . Not on file   Social History Narrative  . No narrative on file    Current Outpatient Prescriptions on File Prior to Visit  Medication Sig Dispense Refill  . bromocriptine (PARLODEL) 2.5 MG tablet Take 1 tablet (2.5 mg total) by mouth at bedtime.  30 tablet  11  . glucose blood (FREESTYLE LITE) test strip Use as instructed once daily dx 250.00       . Liraglutide (VICTOZA) 18 MG/3ML SOLN injection Inject 0.3 mLs (1.8 mg total) into the skin daily.  9 mL  1  .  metFORMIN (GLUCOPHAGE) 1000 MG tablet TAKE 1 TABLET BY MOUTH TWICE DAILY  60 tablet  2  . pioglitazone (ACTOS) 45 MG tablet Take 1 tablet (45 mg total) by mouth daily.  30 tablet  11   No current facility-administered medications on file prior to visit.    No Known Allergies  Family History  Problem Relation Age of Onset  . Diabetes Other     1st degree relative  . Hypertension Other   . Heart disease Other     FH of Cardiac Murmur  . Cancer Other     Colon Cancer    BP 126/74  Pulse 100  Wt 189 lb (85.73 kg)  BMI 25.63 kg/m2  SpO2 97%   Review of Systems denies hypoglycemia and weight change.    Objective:   Physical Exam VITAL SIGNS:  See vs page GENERAL: no distress Pulses: dorsalis pedis intact bilat.   Feet: no deformity.  no ulcer on the feet.  feet are of normal color and temp.  no edema  There is bilateral onychomycosis Neuro: sensation is intact to touch on the feet  Lab Results  Component Value Date   HGBA1C 10.1* 04/09/2013      Assessment & Plan:  DM: therapy limited by noncompliance.  i'll do the  best i can. Onychomycosis, persistent Multinodular goiter, with minimally suppressed TSH.

## 2013-04-09 NOTE — Patient Instructions (Addendum)
blood tests are being requested for you today.  We'll contact you with results. check your blood sugar 1 time a day.  vary the time of day when you check, between before the 3 meals, and at bedtime.  also check if you have symptoms of your blood sugar being too high or too low.  please keep a record of the readings and bring it to your next appointment here.  please call us sooner if your blood sugar goes below 70, or if it stays over 200.   Please come back for a follow-up appointment in 3 months.   i have sent a prescription to your pharmacy, for the toenail fungus.   Please take only the medications listed below.   If your thyroid is overactive, you should consider taking a radioactive iodine pill:  It works like this: We would check a thyroid "scan" (a special, but easy and painless type of thyroid x ray): you go to the x-ray department of the hospital to swallow a pill, which contains a miniscule amount of radiation.  You will not notice any symptoms from this.  You will go back to the x-ray department the next day, to lie down in front of a camera.  The results of this will be sent to me.   Based on the results, i hope to order for you a treatment pill of radioactive iodine.  Although it is a larger amount of radiation, you will again notice no symptoms from this.  The pill is gone from your body in a few days (during which you should stay away from other people), but takes several months to work.  Therefore, please return here approximately 6-8 weeks after the treatment.  This treatment has been available for many years, and the only known side-effect is an underactive thyroid.  It is possible that i would eventually prescribe for you a thyroid hormone pill, which is very inexpensive.  You don't have to worry about side-effects of this thyroid hormone pill, because it is the same molecule your thyroid makes.

## 2013-05-05 ENCOUNTER — Other Ambulatory Visit: Payer: Self-pay | Admitting: Endocrinology

## 2013-06-18 ENCOUNTER — Other Ambulatory Visit: Payer: Self-pay | Admitting: Internal Medicine

## 2013-06-18 ENCOUNTER — Ambulatory Visit
Admission: RE | Admit: 2013-06-18 | Discharge: 2013-06-18 | Disposition: A | Discharge status: Home / Self Care | Payer: BC Managed Care – PPO | Source: Ambulatory Visit | Attending: Internal Medicine | Admitting: Internal Medicine

## 2013-06-18 DIAGNOSIS — M898X8 Other specified disorders of bone, other site: Secondary | ICD-10-CM

## 2013-07-09 ENCOUNTER — Encounter: Payer: Self-pay | Admitting: Endocrinology

## 2013-07-09 ENCOUNTER — Other Ambulatory Visit: Payer: Self-pay | Admitting: Endocrinology

## 2013-07-09 ENCOUNTER — Ambulatory Visit (INDEPENDENT_AMBULATORY_CARE_PROVIDER_SITE_OTHER): Payer: BC Managed Care – PPO | Admitting: Endocrinology

## 2013-07-09 VITALS — BP 136/80 | HR 90 | Ht 72.0 in | Wt 183.0 lb

## 2013-07-09 DIAGNOSIS — E0590 Thyrotoxicosis, unspecified without thyrotoxic crisis or storm: Secondary | ICD-10-CM

## 2013-07-09 DIAGNOSIS — E119 Type 2 diabetes mellitus without complications: Secondary | ICD-10-CM

## 2013-07-09 MED ORDER — CANAGLIFLOZIN 300 MG PO TABS: 1.0000 | ORAL_TABLET | Freq: Every day | ORAL | Status: DC

## 2013-07-09 MED ORDER — CICLOPIROX 8 % EX SOLN: Freq: Every day | CUTANEOUS | Status: DC

## 2013-07-09 NOTE — Progress Notes (Signed)
  Subjective:    Patient ID: Kenneth Durham, male    DOB: [birth year], 47 y.o.   MRN: [id number]  HPI Pt returns for f/u of type 2 DM (dx'ed 2005; he has mild if any neuropathy of the lower extremities; no known associated complications; he refuses insulin, as he is a IT trainer).  He continues to struggle with compliance with meds.  Pt says he still takes dm meds inconsistently.  no cbg record, but states cbg's are in the high-100's Multinodular goiter was noted in 2008.  He was noted in early 2014 for the first time, to have a suppressed TSH.  i-131 was considered, but TSH was only slightly abnormal, so i decided to hold off.   Past Medical History  Diagnosis Date  . GOITER, NONTOXIC MULTINODULAR 09/22/2007  . DIABETES MELLITUS, TYPE II 09/13/2007  . FATTY LIVER DISEASE 10/29/2008  . 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 HYPERCHOLESTEROLEMIA 01/15/2011    No past surgical history on file.  History   Social History  . Marital Status: Married    Spouse Name: N/A    Number of Children: N/A  . Years of Education: N/A   Occupational History  . 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 Smoking status: Former Games developer  . Smokeless tobacco: Not on file  . Alcohol Use: Not on file  . Drug Use: Not on file  . Sexually Active: Not on file   Other Topics Concern  . Not on file   Social History Narrative  . No narrative on file    Current Outpatient Prescriptions on File Prior to Visit  Medication Sig Dispense Refill  . glimepiride (AMARYL) 2 MG tablet TAKE 1 TABLET BY MOUTH TWICE DAILY  60 tablet  0  . glucose blood (FREESTYLE LITE) test strip Use as instructed once daily dx 250.00       . Liraglutide (VICTOZA) 18 MG/3ML SOLN injection Inject 0.3 mLs (1.8 mg total) into the skin daily.  9 mL  1  . bromocriptine (PARLODEL) 2.5 MG tablet Take 1 tablet (2.5 mg total) by mouth at bedtime.  30 tablet  11  . pioglitazone (ACTOS) 45 MG  tablet Take 1 tablet (45 mg total) by mouth daily.  30 tablet  11   No current facility-administered medications on file prior to visit.    No Known Allergies  Family History  Problem Relation Age of Onset  . Diabetes Other     1st degree relative  . Hypertension Other   . Heart disease Other     FH of Cardiac Murmur  . Cancer Other     Colon Cancer   BP 136/80  Pulse 90  Ht 6' (1.829 m)  Wt 183 lb (83.008 kg)  BMI 24.81 kg/m2  SpO2 97%  Review of Systems denies hypoglycemia and weight change    Objective:   Physical Exam VITAL SIGNS:  See vs page GENERAL: no distress  Lab Results  Component Value Date   HGBA1C 11.9* 07/09/2013   Lab Results  Component Value Date   TSH 1.00 07/09/2013      Assessment & Plan:  DM: there are nine oral agents available for type 2 diabetes.  This regimen gives the best risk-benefit ratio.  He should take insulin, but he refuses. Hyperthyroidism, better.  However, it will recur.

## 2013-07-09 NOTE — Patient Instructions (Addendum)
check your blood sugar 1 time a day.  vary the time of day when you check, between before the 3 meals, and at bedtime.  also check if you have symptoms of your blood sugar being too high or too low.  please keep a record of the readings and bring it to your next appointment here.  please call us sooner if your blood sugar goes below 70, or if it stays over 200.   Please come back for a follow-up appointment in 3 months.   blood tests are being requested for you today.  We'll contact you with results.  Please add "invokana." i have sent a prescription to your pharmacy.  Drink plenty of fluids while on this.   It is very important to never miss your medications.

## 2013-08-29 ENCOUNTER — Other Ambulatory Visit: Payer: Self-pay | Admitting: Endocrinology

## 2013-09-19 ENCOUNTER — Telehealth: Payer: Self-pay

## 2013-09-19 ENCOUNTER — Other Ambulatory Visit: Payer: Self-pay | Admitting: Endocrinology

## 2013-09-19 ENCOUNTER — Other Ambulatory Visit: Payer: Self-pay

## 2013-09-19 MED ORDER — LIRAGLUTIDE 18 MG/3ML ~~LOC~~ SOPN: 1.8000 mg | PEN_INJECTOR | Freq: Every day | SUBCUTANEOUS | Status: DC

## 2013-09-19 NOTE — Telephone Encounter (Signed)
Pt left voicemail stating he needs a refill on Victoza, what shoud correct dose be?

## 2013-09-19 NOTE — Telephone Encounter (Signed)
i sent rx 

## 2013-09-24 ENCOUNTER — Other Ambulatory Visit: Payer: Self-pay

## 2013-09-24 MED ORDER — METFORMIN HCL 1000 MG PO TABS: ORAL_TABLET | ORAL | Status: DC

## 2013-11-05 ENCOUNTER — Other Ambulatory Visit: Payer: Self-pay | Admitting: Endocrinology

## 2013-11-24 ENCOUNTER — Other Ambulatory Visit: Payer: Self-pay | Admitting: Endocrinology

## 2013-11-27 ENCOUNTER — Other Ambulatory Visit: Payer: Self-pay

## 2013-11-27 MED ORDER — METFORMIN HCL 1000 MG PO TABS: ORAL_TABLET | ORAL | Status: DC

## 2013-11-27 NOTE — Telephone Encounter (Signed)
Refill for Metformin for 90 day supply.

## 2013-12-25 ENCOUNTER — Other Ambulatory Visit: Payer: Self-pay | Admitting: Endocrinology

## 2014-04-01 ENCOUNTER — Ambulatory Visit (INDEPENDENT_AMBULATORY_CARE_PROVIDER_SITE_OTHER): Payer: BC Managed Care – PPO | Admitting: Endocrinology

## 2014-04-01 ENCOUNTER — Encounter: Payer: Self-pay | Admitting: Endocrinology

## 2014-04-01 VITALS — BP 114/76 | HR 75 | Temp 98.0°F | Ht 72.0 in | Wt 180.0 lb

## 2014-04-01 DIAGNOSIS — E119 Type 2 diabetes mellitus without complications: Secondary | ICD-10-CM

## 2014-04-01 NOTE — Patient Instructions (Addendum)
check your blood sugar 1 time a day.  vary the time of day when you check, between before the 3 meals, and at bedtime.  also check if you have symptoms of your blood sugar being too high or too low.  please keep a record of the readings and bring it to your next appointment here.  please call us sooner if your blood sugar goes below 70, or if it stays over 200.   Please come back for a follow-up appointment in 3 months.   Based on the results, i may need to send in refills of the other diabetes medications.   blood tests are being requested for you today.  We'll contact you with results.  It is very important to never miss your medications.

## 2014-04-01 NOTE — Progress Notes (Signed)
Subjective:    Patient ID: Kenneth Durham, male    DOB: [birth year], 48 y.o.   MRN: [id number]  HPI Pt returns for f/u of type 2 DM (dx'ed 2005; he has mild if any neuropathy of the lower extremities; no known associated complications; he refuses insulin, as he is a Pharmacist, community).  He continues to struggle with compliance with meds.  Pt says he sometimes misses his meds, especially if he goes out of town.  He takes victoza and metformin only.  He refuses to take insulin now.   Multinodular goiter was noted in 2008.  He was noted in early 2014 for the first time, to have a suppressed TSH.  i-131 was considered, but TSH was only slightly abnormal, so i decided to hold off.   Past Medical History  Diagnosis Date  . GOITER, NONTOXIC MULTINODULAR 09/22/2007  . DIABETES MELLITUS, TYPE II 09/13/2007  . FATTY LIVER DISEASE 10/29/2008  . DISEASE, CHRONIC NONALCOHOLIC LIVER NOS 38/12/8297  . HYPERCHOLESTEROLEMIA 01/15/2011    No past surgical history on file.  History   Social History  . Marital Status: Married    Spouse Name: N/A    Number of Children: N/A  . Years of Education: N/A   Occupational History  . 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 Smoking status: Former Research scientist (life sciences)  . Smokeless tobacco: Not on file  . Alcohol Use: Not on file  . Drug Use: Not on file  . Sexual Activity: Not on file   Other Topics Concern  . Not on file   Social History Narrative  . No narrative on file    Current Outpatient Prescriptions on File Prior to Visit  Medication Sig Dispense Refill  . Liraglutide (VICTOZA) 18 MG/3ML SOPN Inject 1.8 mg into the skin daily with breakfast.  3 mL  11  . metFORMIN (GLUCOPHAGE) 1000 MG tablet TAKE 1 TABLET BY MOUTH TWICE DAILY  180 tablet  0  . bromocriptine (PARLODEL) 2.5 MG tablet Take 1 tablet (2.5 mg total) by mouth at bedtime.  30 tablet  11  . Canagliflozin (INVOKANA) 300 MG TABS Take 1 tablet by mouth daily.  30  tablet  11  . chlorhexidine (PERIDEX) 0.12 % solution RINSE WITH 1/2 OZ. TWICE A DAY RINSE FOR 30 SECONDS AND SPIT. DO NOT SWALLOW  473 mL  0  . ciclopirox (PENLAC) 8 % solution Apply topically at bedtime. Apply over nail and surrounding skin. Apply daily over previous coat. After seven (7) days, may remove with alcohol and continue cycle.  6.6 mL  11  . glimepiride (AMARYL) 2 MG tablet TAKE 1 TABLET BY MOUTH TWICE DAILY  60 tablet  0  . glucose blood (FREESTYLE LITE) test strip Use as instructed once daily dx 250.00       . pioglitazone (ACTOS) 45 MG tablet Take 1 tablet (45 mg total) by mouth daily.  30 tablet  11   No current facility-administered medications on file prior to visit.    No Known Allergies  Family History  Problem Relation Age of Onset  . Diabetes Other     1st degree relative  . Hypertension Other   . Heart disease Other     FH of Cardiac Murmur  . Cancer Other     Colon Cancer    BP 114/76  Pulse 75  Temp(Src) 98 F (36.7 C) (Oral)  Ht 6' (1.829 m)  Wt 180  lb (81.647 kg)  BMI 24.41 kg/m2  SpO2 98%  Review of Systems He denies hypoglycemia.  He has lost weight.      Objective:   Physical Exam VITAL SIGNS:  See vs page GENERAL: no distress      Assessment & Plan:  DM: probably poorly-controlled. Noncompliance: this complicates the rx of DM. Weight-loss: prob due to severe hyperglycemia.

## 2014-04-02 ENCOUNTER — Other Ambulatory Visit: Payer: Self-pay | Admitting: Endocrinology

## 2014-04-02 LAB — MICROALBUMIN / CREATININE URINE RATIO
CREATININE, U: 199.3 mg/dL
MICROALB UR: 23.6 mg/dL — AB (ref 0.0–1.9)
Microalb Creat Ratio: 11.8 mg/g (ref 0.0–30.0)

## 2014-04-02 LAB — HEMOGLOBIN A1C: Hgb A1c MFr Bld: 11.3 % — ABNORMAL HIGH (ref 4.6–6.5)

## 2014-04-02 MED ORDER — LIRAGLUTIDE 18 MG/3ML ~~LOC~~ SOPN: 1.8000 mg | PEN_INJECTOR | Freq: Every day | SUBCUTANEOUS | Status: DC

## 2014-04-02 MED ORDER — PIOGLITAZONE HCL 45 MG PO TABS: 45.0000 mg | ORAL_TABLET | Freq: Every day | ORAL | Status: DC

## 2014-04-02 MED ORDER — BROMOCRIPTINE MESYLATE 2.5 MG PO TABS: 2.5000 mg | ORAL_TABLET | Freq: Every day | ORAL | Status: DC

## 2014-04-02 MED ORDER — GLIMEPIRIDE 2 MG PO TABS: ORAL_TABLET | ORAL | Status: DC

## 2014-04-02 MED ORDER — CANAGLIFLOZIN 300 MG PO TABS: 1.0000 | ORAL_TABLET | Freq: Every day | ORAL | Status: DC

## 2014-05-16 ENCOUNTER — Other Ambulatory Visit: Payer: Self-pay

## 2014-05-16 MED ORDER — PIOGLITAZONE HCL 45 MG PO TABS: 45.0000 mg | ORAL_TABLET | Freq: Every day | ORAL | Status: DC

## 2014-06-07 ENCOUNTER — Other Ambulatory Visit: Payer: Self-pay | Admitting: *Deleted

## 2014-06-07 MED ORDER — GLIMEPIRIDE 2 MG PO TABS: ORAL_TABLET | ORAL | Status: DC

## 2014-07-01 ENCOUNTER — Ambulatory Visit (INDEPENDENT_AMBULATORY_CARE_PROVIDER_SITE_OTHER): Payer: BC Managed Care – PPO | Admitting: Endocrinology

## 2014-07-01 ENCOUNTER — Encounter: Payer: Self-pay | Admitting: Endocrinology

## 2014-07-01 VITALS — BP 118/80 | HR 87 | Temp 98.1°F | Ht 72.0 in | Wt 191.0 lb

## 2014-07-01 DIAGNOSIS — E0590 Thyrotoxicosis, unspecified without thyrotoxic crisis or storm: Secondary | ICD-10-CM

## 2014-07-01 DIAGNOSIS — E119 Type 2 diabetes mellitus without complications: Secondary | ICD-10-CM

## 2014-07-01 MED ORDER — ACARBOSE 50 MG PO TABS: 50.0000 mg | ORAL_TABLET | Freq: Every day | ORAL | Status: DC

## 2014-07-01 NOTE — Patient Instructions (Addendum)
check your blood sugar 1 time a day.  vary the time of day when you check, between before the 3 meals, and at bedtime.  also check if you have symptoms of your blood sugar being too high or too low.  please keep a record of the readings and bring it to your next appointment here.  please call us sooner if your blood sugar goes below 70, or if it stays over 200.   Please come back for a follow-up appointment in 3 months.   blood tests are being requested for you today.  We'll contact you with results.  It is very important to never miss your medications.   i have sent a prescription to your pharmacy, to add a low dosage of "acarbose."

## 2014-07-01 NOTE — Progress Notes (Signed)
Subjective:    Patient ID: Kenneth Durham, male    DOB: 28-Dec-1965, 48 y.o.   MRN: [id number]  HPI Pt returns for f/u of type 2 DM (dx'ed 2005; he has mild if any neuropathy of the lower extremities; no known associated complications; he refuses insulin, as he is a Pharmacist, community; he takes victoza+5 oral DM meds).  He continues to struggle with compliance with meds.   no cbg record, but states cbg's are in the 200's.  He takes victoza intermittently.   Multinodular goiter was noted in 2008.  He was noted in early 2014 for the first time, to have a suppressed TSH.  i-131 was considered, but TSH was only slightly abnormal, so i decided to hold off.   Past Medical History  Diagnosis Date  . GOITER, NONTOXIC MULTINODULAR 09/22/2007  . DIABETES MELLITUS, TYPE II 09/13/2007  . FATTY LIVER DISEASE 10/29/2008  . DISEASE, CHRONIC NONALCOHOLIC LIVER NOS 74/01/5955  . HYPERCHOLESTEROLEMIA 01/15/2011    No past surgical history on file.  History   Social History  . Marital Status: Married    Spouse Name: N/A    Number of Children: N/A  . Years of Education: N/A   Occupational History  . 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 Smoking status: Former Research scientist (life sciences)  . Smokeless tobacco: Not on file  . Alcohol Use: Not on file  . Drug Use: Not on file  . Sexual Activity: Not on file   Other Topics Concern  . Not on file   Social History Narrative  . No narrative on file    Current Outpatient Prescriptions on File Prior to Visit  Medication Sig Dispense Refill  . bromocriptine (PARLODEL) 2.5 MG tablet Take 1 tablet (2.5 mg total) by mouth at bedtime.  30 tablet  11  . Canagliflozin (INVOKANA) 300 MG TABS Take 1 tablet (300 mg total) by mouth daily.  30 tablet  11  . chlorhexidine (PERIDEX) 0.12 % solution RINSE WITH 1/2 OZ. TWICE A DAY RINSE FOR 30 SECONDS AND SPIT. DO NOT SWALLOW  473 mL  0  . ciclopirox (PENLAC) 8 % solution Apply topically at bedtime.  Apply over nail and surrounding skin. Apply daily over previous coat. After seven (7) days, may remove with alcohol and continue cycle.  6.6 mL  11  . glimepiride (AMARYL) 2 MG tablet TAKE 1 TABLET BY MOUTH TWICE DAILY  180 tablet  1  . glucose blood (FREESTYLE LITE) test strip Use as instructed once daily dx 250.00       . metFORMIN (GLUCOPHAGE) 1000 MG tablet TAKE 1 TABLET BY MOUTH TWICE DAILY  180 tablet  0  . pioglitazone (ACTOS) 45 MG tablet Take 1 tablet (45 mg total) by mouth daily.  90 tablet  2  . Liraglutide (VICTOZA) 18 MG/3ML SOPN Inject 1.8 mg into the skin daily with breakfast.  3 mL  11   No current facility-administered medications on file prior to visit.    No Known Allergies  Family History  Problem Relation Age of Onset  . Diabetes Other     1st degree relative  . Hypertension Other   . Heart disease Other     FH of Cardiac Murmur  . Cancer Other     Colon Cancer    BP 118/80  Pulse 87  Temp(Src) 98.1 F (36.7 C) (Oral)  Ht 6' (1.829 m)  Wt 191 lb (86.637 kg)  BMI 25.90 kg/m2  SpO2 97%   Review of Systems He denies hypoglycemia and weight change    Objective:   Physical Exam VITAL SIGNS:  See vs page GENERAL: no distress Pulses: dorsalis pedis intact bilat.   Feet: no deformity. normal color and temp.  no edema Skin:  no ulcer on the feet.   Neuro: sensation is intact to touch on the feet.   Lab Results  Component Value Date   HGBA1C 11.8* 07/01/2014   Lab Results  Component Value Date   TSH 0.43 07/01/2014      Assessment & Plan:  Multinodular goiter: euthyroid: we'll follow TSH. DM: severe exacerbation.   occupational status: this is discouraging him from taking insulin.  However, he can be disqualified from [facility] for needing insulin, not just taking it.    Patient is advised the following: Patient Instructions  check your blood sugar 1 time a day.  vary the time of day when you check, between before the 3 meals, and at bedtime.  also  check if you have symptoms of your blood sugar being too high or too low.  please keep a record of the readings and bring it to your next appointment here.  please call us sooner if your blood sugar goes below 70, or if it stays over 200.   Please come back for a follow-up appointment in 3 months.   blood tests are being requested for you today.  We'll contact you with results.  It is very important to never miss your medications.   i have sent a prescription to your pharmacy, to add a low dosage of "acarbose."    Blood sugar is really high.  Only insulin can bring this down. Please let me know if you decide to take

## 2014-07-02 LAB — HEMOGLOBIN A1C: Hgb A1c MFr Bld: 11.8 % — ABNORMAL HIGH (ref 4.6–6.5)

## 2014-07-02 LAB — TSH: TSH: 0.43 u[IU]/mL (ref 0.35–4.50)

## 2014-10-01 ENCOUNTER — Ambulatory Visit: Payer: BC Managed Care – PPO | Admitting: Endocrinology

## 2014-10-14 ENCOUNTER — Other Ambulatory Visit: Payer: Self-pay | Admitting: Endocrinology

## 2014-11-04 ENCOUNTER — Ambulatory Visit (INDEPENDENT_AMBULATORY_CARE_PROVIDER_SITE_OTHER): Payer: BC Managed Care – PPO | Admitting: Endocrinology

## 2014-11-04 ENCOUNTER — Encounter: Payer: Self-pay | Admitting: Endocrinology

## 2014-11-04 VITALS — BP 116/80 | HR 92 | Temp 97.8°F | Ht 72.0 in | Wt 185.0 lb

## 2014-11-04 DIAGNOSIS — E119 Type 2 diabetes mellitus without complications: Secondary | ICD-10-CM

## 2014-11-04 DIAGNOSIS — K769 Liver disease, unspecified: Secondary | ICD-10-CM

## 2014-11-04 LAB — HEPATIC FUNCTION PANEL
ALT: 26 U/L (ref 0–53)
AST: 17 U/L (ref 0–37)
Albumin: 4 g/dL (ref 3.5–5.2)
Alkaline Phosphatase: 61 U/L (ref 39–117)
Bilirubin, Direct: 0.1 mg/dL (ref 0.0–0.3)
Total Bilirubin: 0.6 mg/dL (ref 0.2–1.2)
Total Protein: 7 g/dL (ref 6.0–8.3)

## 2014-11-04 LAB — LIPID PANEL
CHOLESTEROL: 219 mg/dL — AB (ref 0–200)
HDL: 68.9 mg/dL (ref >39.00)
LDL Cholesterol: 136 mg/dL — ABNORMAL HIGH (ref 0–99)
NonHDL: 150.1
TRIGLYCERIDES: 73 mg/dL (ref 0.0–149.0)
Total CHOL/HDL Ratio: 3
VLDL: 14.6 mg/dL (ref 0.0–40.0)

## 2014-11-04 LAB — HEMOGLOBIN A1C: Hgb A1c MFr Bld: 14 % — ABNORMAL HIGH (ref 4.6–6.5)

## 2014-11-04 MED ORDER — TERBINAFINE HCL 250 MG PO TABS: 250.0000 mg | ORAL_TABLET | Freq: Every day | ORAL | Status: DC

## 2014-11-04 MED ORDER — ACARBOSE 50 MG PO TABS: 50.0000 mg | ORAL_TABLET | Freq: Every day | ORAL | Status: DC

## 2014-11-04 NOTE — Patient Instructions (Addendum)
check your blood sugar 1 time a day.  vary the time of day when you check, between before the 3 meals, and at bedtime.  also check if you have symptoms of your blood sugar being too high or too low.  please keep a record of the readings and bring it to your next appointment here.  please call us sooner if your blood sugar goes below 70, or if it stays over 200.   Please come back for a follow-up appointment in 3 months.   blood tests are being requested for you today.  We'll contact you with results.  i have sent a prescription to your pharmacy, to start the acarbose. i have also sent a prescription to your pharmacy, for the toenail fungus.  Please do not start this until i have let you know that the liver is normal.

## 2014-11-04 NOTE — Progress Notes (Signed)
Subjective:    Patient ID: Kenneth Durham, male    DOB: 1966/08/13, 48 y.o.   MRN: [id number]  HPI  Pt returns for f/u of diabetes mellitus: DM type: 2 Dx'ed: 7096 Complications: none Therapy: victoza+5 oral DM meds DKA: never Severe hypoglycemia: never Pancreatitis: never Other: he refuses insulin, as he is a trucker; due to lean body habitus and poor control with multiple orals, he is presumed to be evolving type 1 DM. 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  He also did not start the acarbose.  no cbg record, but states cbg's vary from 150-200's. Multinodular goiter was noted in 2008.  He was noted in early 2014 for the first time, to have a suppressed TSH.  i-131 was considered, but TSH was only slightly abnormal, so i decided to hold off. Past Medical History  Diagnosis Date  . GOITER, NONTOXIC MULTINODULAR 09/22/2007  . DIABETES MELLITUS, TYPE II 09/13/2007  . FATTY LIVER DISEASE 10/29/2008  . DISEASE, CHRONIC NONALCOHOLIC LIVER NOS 28/02/6628  . HYPERCHOLESTEROLEMIA 01/15/2011    No past surgical history on file.  History   Social History  . Marital Status: Married    Spouse Name: N/A    Number of Children: N/A  . Years of Education: N/A   Occupational History  . 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 Smoking status: Former Research scientist (life sciences)  . Smokeless tobacco: Not on file  . Alcohol Use: Not on file  . Drug Use: Not on file  . Sexual Activity: Not on file   Other Topics Concern  . Not on file   Social History Narrative    Current Outpatient Prescriptions on File Prior to Visit  Medication Sig Dispense Refill  . bromocriptine (PARLODEL) 2.5 MG tablet Take 1 tablet (2.5 mg total) by mouth at bedtime. 30 tablet 11  . chlorhexidine (PERIDEX) 0.12 % solution RINSE WITH 1/2 OZ. TWICE A DAY RINSE FOR 30 SECONDS AND SPIT. DO NOT SWALLOW 473  mL 0  . ciclopirox (PENLAC) 8 % solution Apply topically at bedtime. Apply over nail and surrounding skin. Apply daily over previous coat. After seven (7) days, may remove with alcohol and continue cycle. 6.6 mL 11  . glimepiride (AMARYL) 2 MG tablet TAKE 1 TABLET BY MOUTH TWICE DAILY 180 tablet 1  . glucose blood (FREESTYLE LITE) test strip Use as instructed once daily dx 250.00     . metFORMIN (GLUCOPHAGE) 1000 MG tablet TAKE 1 TABLET BY MOUTH TWICE DAILY 180 tablet 0  . Liraglutide (VICTOZA) 18 MG/3ML SOPN Inject 1.8 mg into the skin daily with breakfast. 3 mL 11  . pioglitazone (ACTOS) 45 MG tablet Take 1 tablet (45 mg total) by mouth daily. 90 tablet 2   No current facility-administered medications on file prior to visit.    No Known Allergies  Family History  Problem Relation Age of Onset  . Diabetes Other     1st degree relative  . Hypertension Other   . Heart disease Other     FH of Cardiac Murmur  . Cancer Other     Colon Cancer    BP 116/80 mmHg  Pulse 92  Temp(Src) 97.8 F (36.6 C) (Oral)  Ht 6' (1.829 m)  Wt 185 lb (83.915 kg)  BMI 25.08 kg/m2  SpO2 98%   Review of Systems He denies hypoglycemia  and weight change.      Objective:   Physical Exam VITAL SIGNS:  See vs page GENERAL: no distress Pulses: dorsalis pedis intact bilat.   Feet: no deformity.  no edema.  There is bilateral onychomycosis.   Skin:  no ulcer on the feet.  normal color and temp. Neuro: sensation is intact to touch on the feet.    Lab Results  Component Value Date   HGBA1C 14.0* 11/04/2014   Lab Results  Component Value Date   ALT 26 11/04/2014   AST 17 11/04/2014   ALKPHOS 61 11/04/2014   BILITOT 0.6 11/04/2014      Assessment & Plan:  DM: severe exacerbation. Occupational status: on this basis, he is refusing insulin.  Noncompliance with cbg recording, persistent: I'll work around this as best I can.  Onychomycosis.  He requests medical rx.  Patient is advised the  following: Patient Instructions  check your blood sugar 1 time a day.  vary the time of day when you check, between before the 3 meals, and at bedtime.  also check if you have symptoms of your blood sugar being too high or too low.  please keep a record of the readings and bring it to your next appointment here.  please call us sooner if your blood sugar goes below 70, or if it stays over 200.   Please come back for a follow-up appointment in 3 months.   blood tests are being requested for you today.  We'll contact you with results.  i have sent a prescription to your pharmacy, to start the acarbose. i have also sent a prescription to your pharmacy, for the toenail fungus.  Please do not start this until i have let you know that the liver is normal.

## 2015-01-19 ENCOUNTER — Other Ambulatory Visit: Payer: Self-pay | Admitting: Endocrinology

## 2015-02-04 ENCOUNTER — Encounter: Payer: Self-pay | Admitting: Endocrinology

## 2015-02-04 ENCOUNTER — Ambulatory Visit (INDEPENDENT_AMBULATORY_CARE_PROVIDER_SITE_OTHER): Payer: BLUE CROSS/BLUE SHIELD | Admitting: Endocrinology

## 2015-02-04 ENCOUNTER — Ambulatory Visit: Payer: BC Managed Care – PPO | Admitting: Endocrinology

## 2015-02-04 VITALS — BP 116/64 | HR 91 | Temp 98.8°F | Ht 72.0 in | Wt 172.0 lb

## 2015-02-04 DIAGNOSIS — E119 Type 2 diabetes mellitus without complications: Secondary | ICD-10-CM | POA: Diagnosis not present

## 2015-02-04 DIAGNOSIS — E0590 Thyrotoxicosis, unspecified without thyrotoxic crisis or storm: Secondary | ICD-10-CM

## 2015-02-04 LAB — BASIC METABOLIC PANEL
BUN: 13 mg/dL (ref 6–23)
CALCIUM: 9.7 mg/dL (ref 8.4–10.5)
CO2: 32 meq/L (ref 19–32)
Chloride: 94 mEq/L — ABNORMAL LOW (ref 96–112)
Creatinine, Ser: 1.1 mg/dL (ref 0.40–1.50)
GFR: 91.28 mL/min (ref >60.00)
Glucose, Bld: 506 mg/dL (ref 70–99)
Potassium: 5 mEq/L (ref 3.5–5.1)
SODIUM: 131 meq/L — AB (ref 135–145)

## 2015-02-04 LAB — HEMOGLOBIN A1C: Hgb A1c MFr Bld: 17.6 % — ABNORMAL HIGH (ref 4.6–6.5)

## 2015-02-04 LAB — TSH: TSH: 1.58 u[IU]/mL (ref 0.35–4.50)

## 2015-02-04 MED ORDER — PIOGLITAZONE HCL 45 MG PO TABS: 45.0000 mg | ORAL_TABLET | Freq: Every day | ORAL | Status: DC

## 2015-02-04 MED ORDER — DULAGLUTIDE 1.5 MG/0.5ML ~~LOC~~ SOAJ: 1.5000 mg | SUBCUTANEOUS | Status: DC

## 2015-02-04 MED ORDER — METFORMIN HCL 1000 MG PO TABS: 1000.0000 mg | ORAL_TABLET | Freq: Two times a day (BID) | ORAL | Status: DC

## 2015-02-04 MED ORDER — GLIMEPIRIDE 2 MG PO TABS: 2.0000 mg | ORAL_TABLET | Freq: Two times a day (BID) | ORAL | Status: DC

## 2015-02-04 MED ORDER — BROMOCRIPTINE MESYLATE 2.5 MG PO TABS: 2.5000 mg | ORAL_TABLET | Freq: Every day | ORAL | Status: DC

## 2015-02-04 NOTE — Patient Instructions (Addendum)
check your blood sugar 1 time a day.  vary the time of day when you check, between before the 3 meals, and at bedtime.  also check if you have symptoms of your blood sugar being too high or too low.  please keep a record of the readings and bring it to your next appointment here.  please call us sooner if your blood sugar goes below 70, or if it stays over 200.   Please come back for a follow-up appointment in 3 months.   blood tests are being requested for you today.  We'll contact you with results.  i have sent prescriptions to your pharmacy, to resume the bromocriptine, pioglitizone, and glimepiride.   Without insulin, I believe you will land in the hospital eventually.  Please let me know if you decide to take it.  i have also sent a prescription to your pharmacy, to change the victoza to trulicity.

## 2015-02-04 NOTE — Progress Notes (Signed)
Subjective:    Patient ID: Kenneth Durham, male    DOB: [birth year], 49 y.o.   MRN: [id number]  HPI Pt returns for f/u of diabetes mellitus: DM type: is probably evolving type 1 (due to severe hyperglycemia and lean body habitus) Dx'ed: 3762 Complications: none Therapy: victoza+5 oral DM meds DKA: never Severe hypoglycemia: never Pancreatitis: never Other: he refuses insulin, as he is a trucker; due to lean body habitus and poor control with multiple orals, he is presumed to be evolving type 1 DM. 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  He also did not start the acarbose.  He has polyuria.  He has not recently checked cbg's.   Multinodular goiter was noted in 2008.  He was noted in early 2014 for the first time, to have a suppressed TSH.  i-131 was considered, but TSH was only slightly abnormal, so i decided to hold off.   Past Medical History  Diagnosis Date  . GOITER, NONTOXIC MULTINODULAR 09/22/2007  . DIABETES MELLITUS, TYPE II 09/13/2007  . FATTY LIVER DISEASE 10/29/2008  . DISEASE, CHRONIC NONALCOHOLIC LIVER NOS 83/12/5174  . HYPERCHOLESTEROLEMIA 01/15/2011    No past surgical history on file.  History   Social History  . Marital Status: Married    Spouse Name: N/A  . Number of Children: N/A  . Years of Education: N/A   Occupational History  . 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 Smoking status: Former Research scientist (life sciences)  . Smokeless tobacco: Not on file  . Alcohol Use: Not on file  . Drug Use: Not on file  . Sexual Activity: Not on file   Other Topics Concern  . Not on file   Social History Narrative    Current Outpatient Prescriptions on File Prior to Visit  Medication Sig Dispense Refill  . acarbose (PRECOSE) 50 MG tablet Take 1 tablet (50 mg total) by mouth daily before supper. 90 tablet 3  . glucose blood (FREESTYLE LITE) test  strip Use as instructed once daily dx 250.00     . chlorhexidine (PERIDEX) 0.12 % solution RINSE WITH 1/2 OZ. TWICE A DAY RINSE FOR 30 SECONDS AND SPIT. DO NOT SWALLOW (Patient not taking: Reported on 02/04/2015) 473 mL 0  . ciclopirox (PENLAC) 8 % solution Apply topically at bedtime. Apply over nail and surrounding skin. Apply daily over previous coat. After seven (7) days, may remove with alcohol and continue cycle. (Patient not taking: Reported on 02/04/2015) 6.6 mL 11   No current facility-administered medications on file prior to visit.    No Known Allergies  Family History  Problem Relation Age of Onset  . Diabetes Other     1st degree relative  . Hypertension Other   . Heart disease Other     FH of Cardiac Murmur  . Cancer Other     Colon Cancer    BP 116/64 mmHg  Pulse 91  Temp(Src) 98.8 F (37.1 C) (Oral)  Ht 6' (1.829 m)  Wt 172 lb (78.019 kg)  BMI 23.32 kg/m2  SpO2 96%  Review of Systems He has lost 13 lbs since last ov.  He denies hypoglycemia.      Objective:   Physical Exam VITAL SIGNS:  See vs page GENERAL: no distress Pulses: dorsalis pedis intact bilat.   MSK: no deformity of the feet CV: no leg edema  Skin:  no ulcer on the feet.  normal color and temp on the feet. Neuro: sensation is intact to touch on the feet Ext: There is bilateral onychomycosis of the toenails  Lab Results  Component Value Date   HGBA1C 17.6* 02/04/2015       Assessment & Plan:  DM: severe exacerbation Weight loss: due to severe hyperglycemia. Noncompliance with cbg recording, meds, and my advice to take insulin. Hyperthyroidism, better off any rx.  We'll follow.   Patient is advised the following: Patient Instructions  check your blood sugar 1 time a day.  vary the time of day when you check, between before the 3 meals, and at bedtime.  also check if you have symptoms of your blood sugar being too high or too low.  please keep a record of the readings and bring it to your  next appointment here.  please call us sooner if your blood sugar goes below 70, or if it stays over 200.   Please come back for a follow-up appointment in 3 months.   blood tests are being requested for you today.  We'll contact you with results.  i have sent prescriptions to your pharmacy, to resume the bromocriptine, pioglitizone, and glimepiride.   Without insulin, I believe you will land in the hospital eventually.  Please let me know if you decide to take it.  i have also sent a prescription to your pharmacy, to change the victoza to trulicity.

## 2015-04-03 IMAGING — CR DG CHEST 2V
2 series · 2 of 2 positions shown · non-contrast
Comparison: Chest x-ray 01/27/2009.

CLINICAL DATA: Mass in the inferior aspect of the sternum.

CHEST - 2 VIEW,LEFT RIBS - 2 VIEW,RIGHT RIBS - 2 VIEW

[w chest pa]
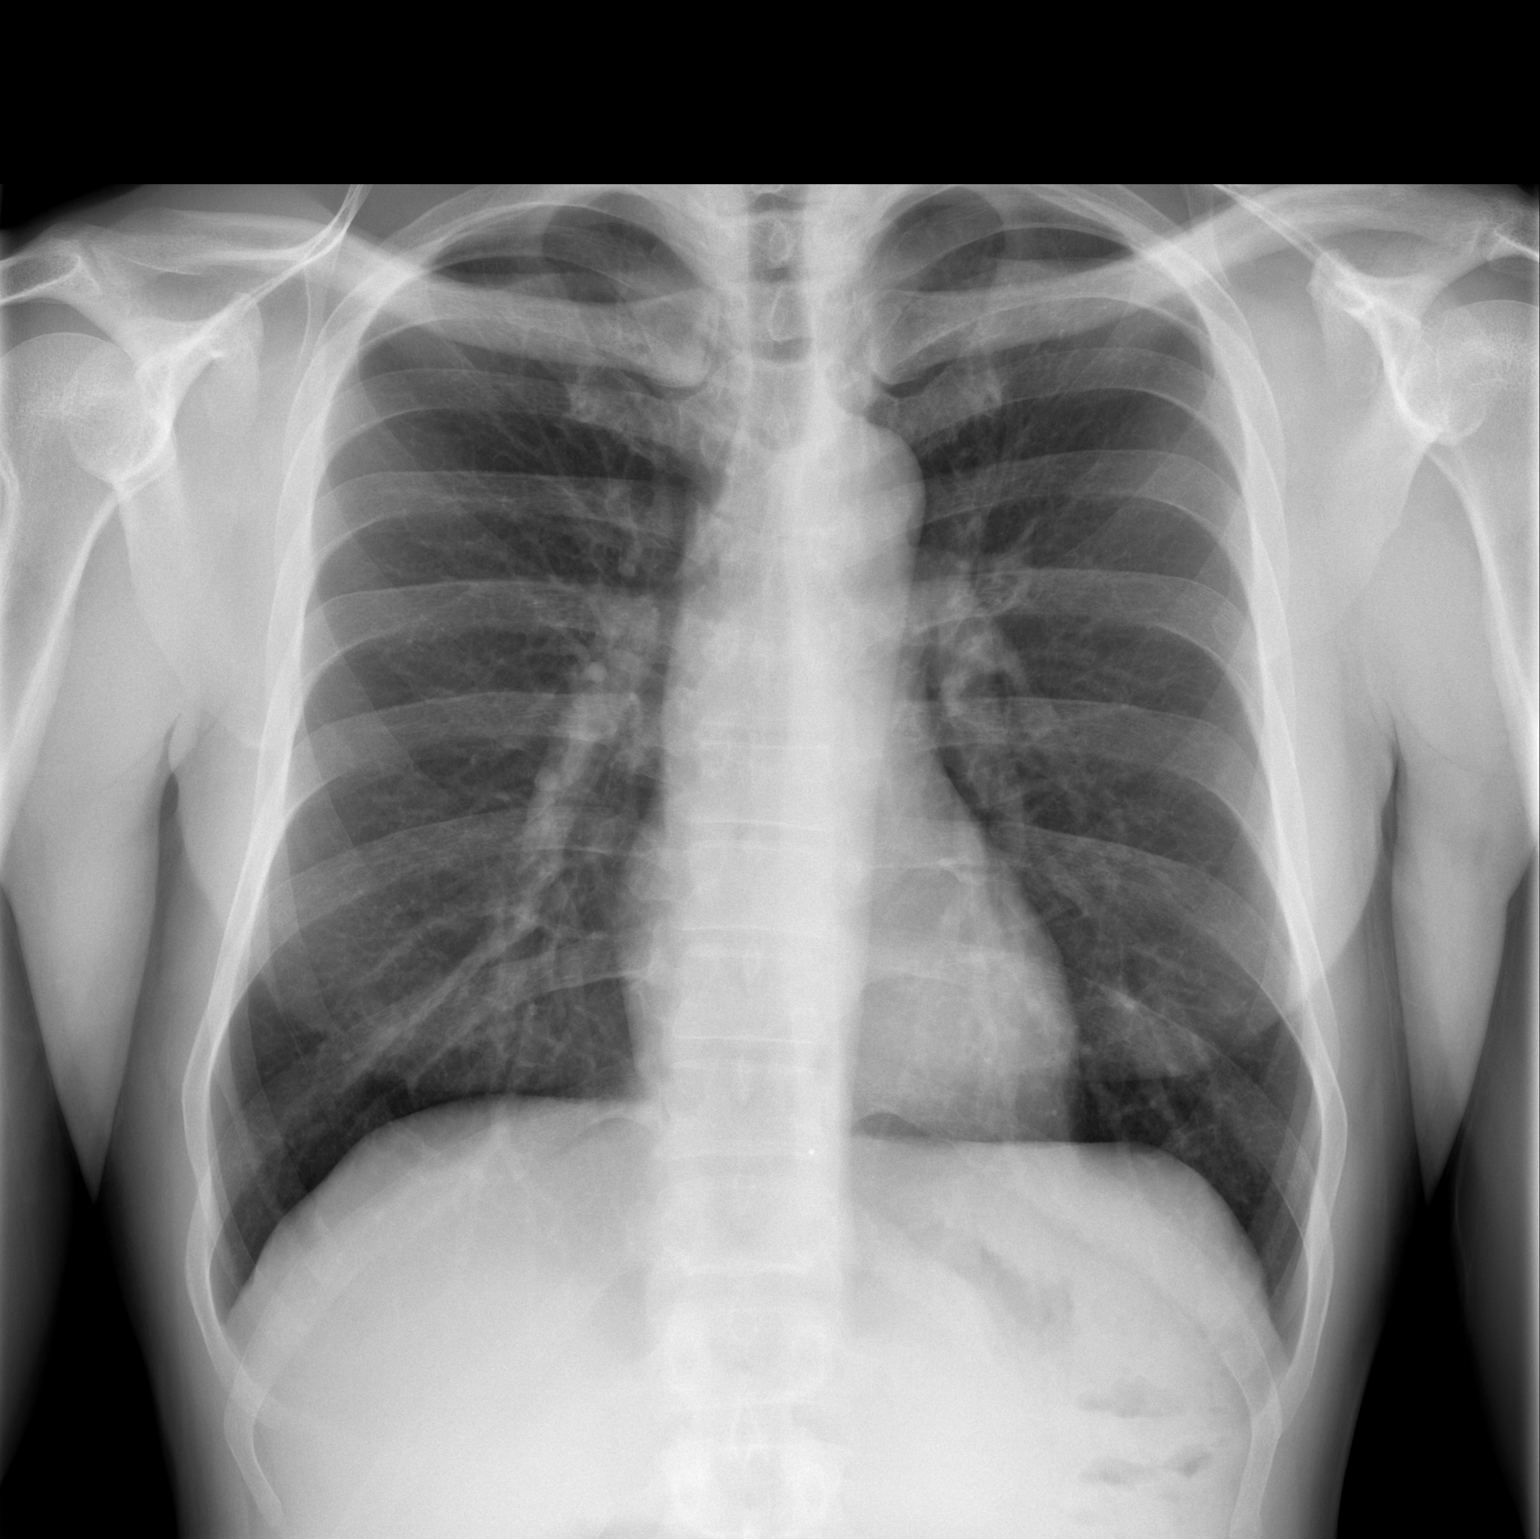

[w chest lat]
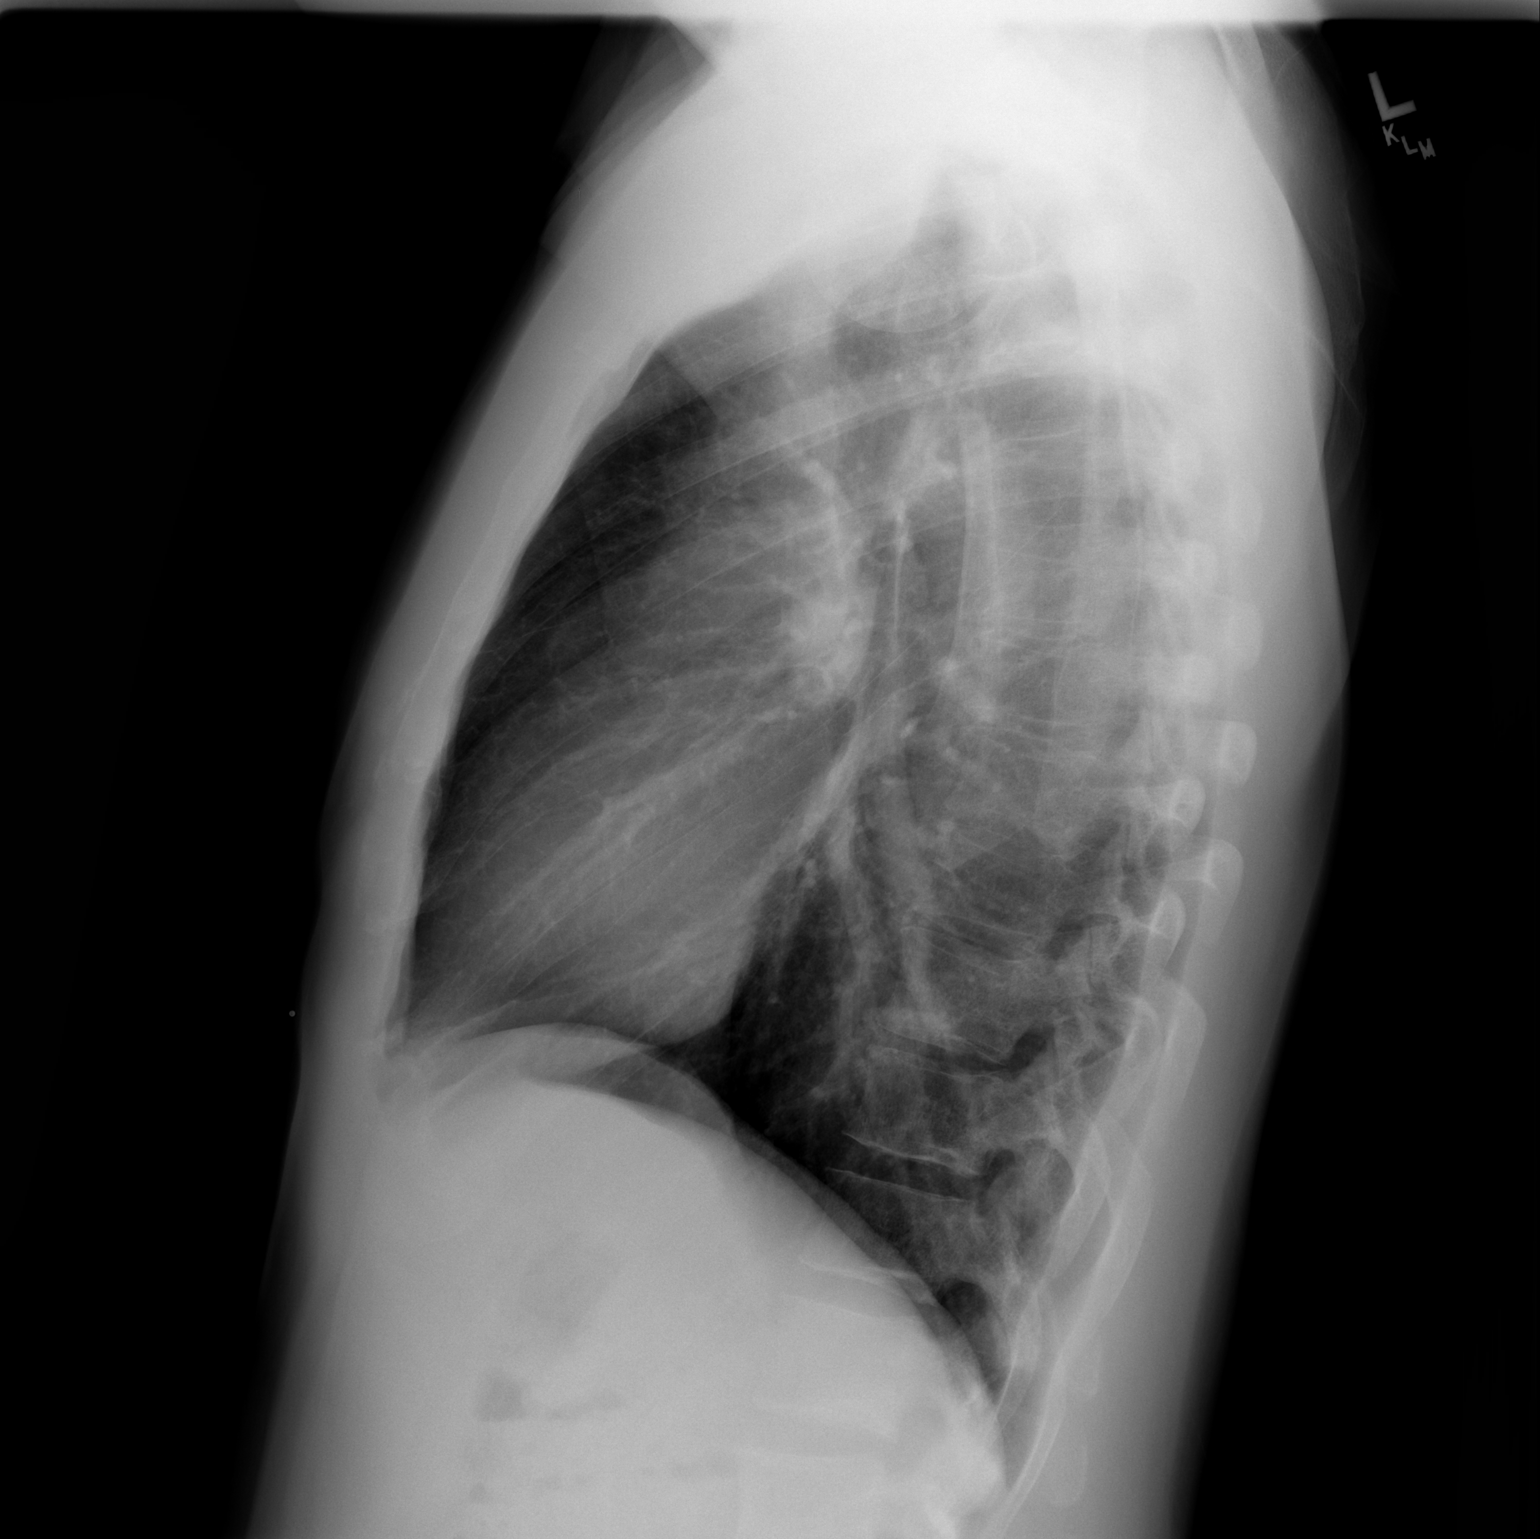

[2 of 2 positions shown; findings below may reference images not displayed]

FINDINGS: Lung volumes are normal.  No consolidative airspace
disease.  No pleural effusions.  No pneumothorax.  No pulmonary
nodule or mass noted.  Pulmonary vasculature and the
cardiomediastinal silhouette are within normal limits.  On the
lateral projection the marker placed over the inferior aspect of
the sternum is noted, but no definite underlying masses confidently
identified on this plain film examination.

Dedicated views of the left ribs demonstrate no acute displaced
fractures, and no definite bony mass.

Dedicated views of the right ribs demonstrate no definite displaced
fracture and no definite bony mass.
IMPRESSION: 1.  No soft tissue or bony mass is identified to correspond to the
palpable abnormality in the inferior aspect of the sternum.
2.  No radiographic evidence of acute cardiopulmonary disease.

## 2015-04-03 IMAGING — CR DG RIBS 2V*R*
2 series · 2 of 2 positions shown · non-contrast
Comparison: Chest x-ray 01/27/2009.

CLINICAL DATA: Mass in the inferior aspect of the sternum.

CHEST - 2 VIEW,LEFT RIBS - 2 VIEW,RIGHT RIBS - 2 VIEW

[w ribs ap/pa lower right *]
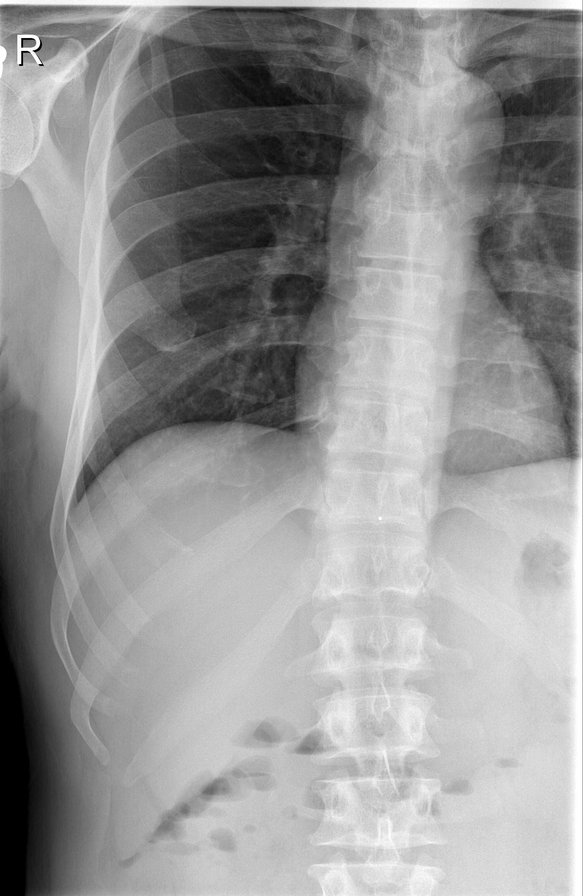

[w ribs oblique right *]
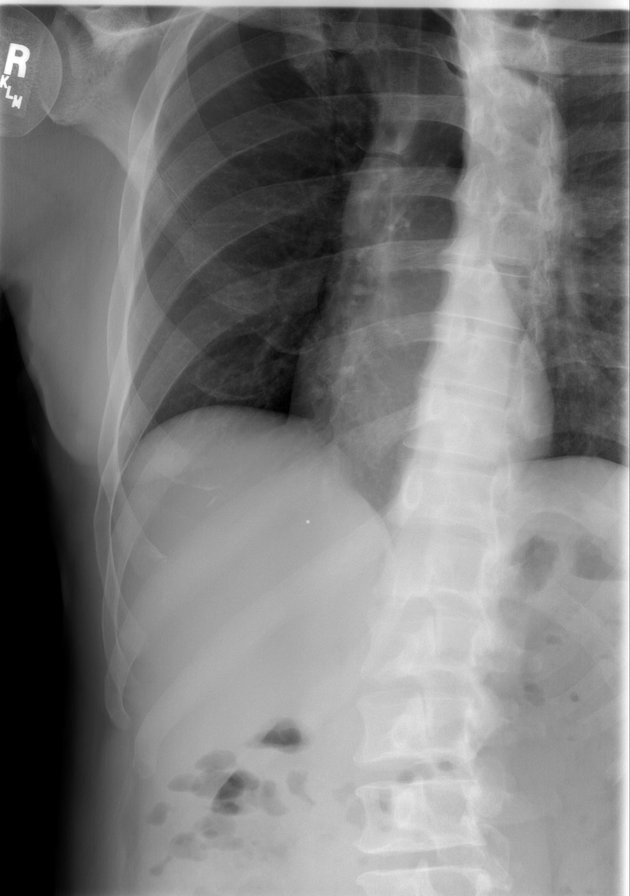

[2 of 2 positions shown; findings below may reference images not displayed]

FINDINGS: Lung volumes are normal.  No consolidative airspace
disease.  No pleural effusions.  No pneumothorax.  No pulmonary
nodule or mass noted.  Pulmonary vasculature and the
cardiomediastinal silhouette are within normal limits.  On the
lateral projection the marker placed over the inferior aspect of
the sternum is noted, but no definite underlying masses confidently
identified on this plain film examination.

Dedicated views of the left ribs demonstrate no acute displaced
fractures, and no definite bony mass.

Dedicated views of the right ribs demonstrate no definite displaced
fracture and no definite bony mass.
IMPRESSION: 1.  No soft tissue or bony mass is identified to correspond to the
palpable abnormality in the inferior aspect of the sternum.
2.  No radiographic evidence of acute cardiopulmonary disease.

## 2015-04-03 IMAGING — CR DG RIBS 2V*L*
2 series · 2 of 2 positions shown · non-contrast
Comparison: Chest x-ray 01/27/2009.

CLINICAL DATA: Mass in the inferior aspect of the sternum.

CHEST - 2 VIEW,LEFT RIBS - 2 VIEW,RIGHT RIBS - 2 VIEW

[w ribs ap/pa lower left *]
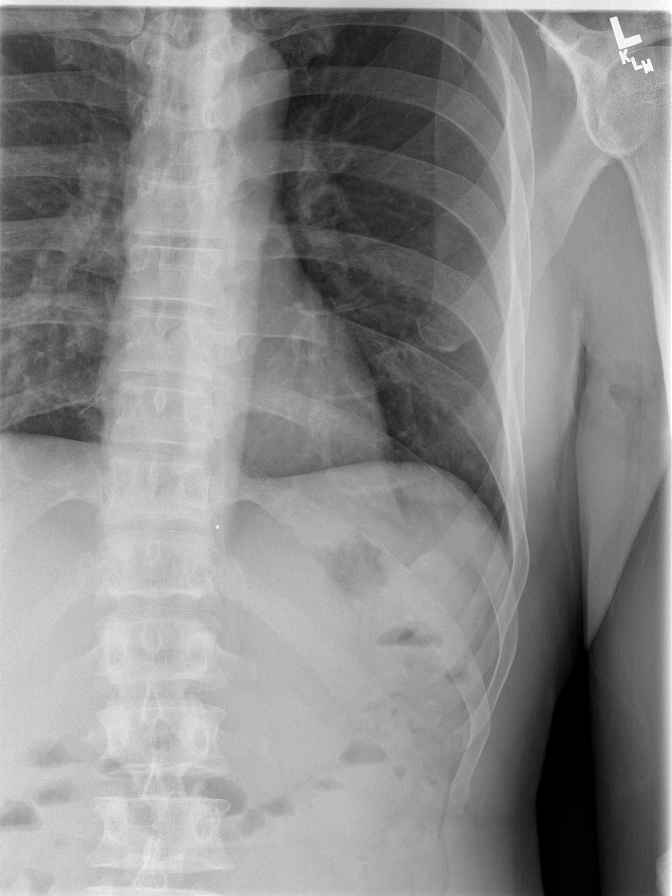

[w ribs oblique left *]
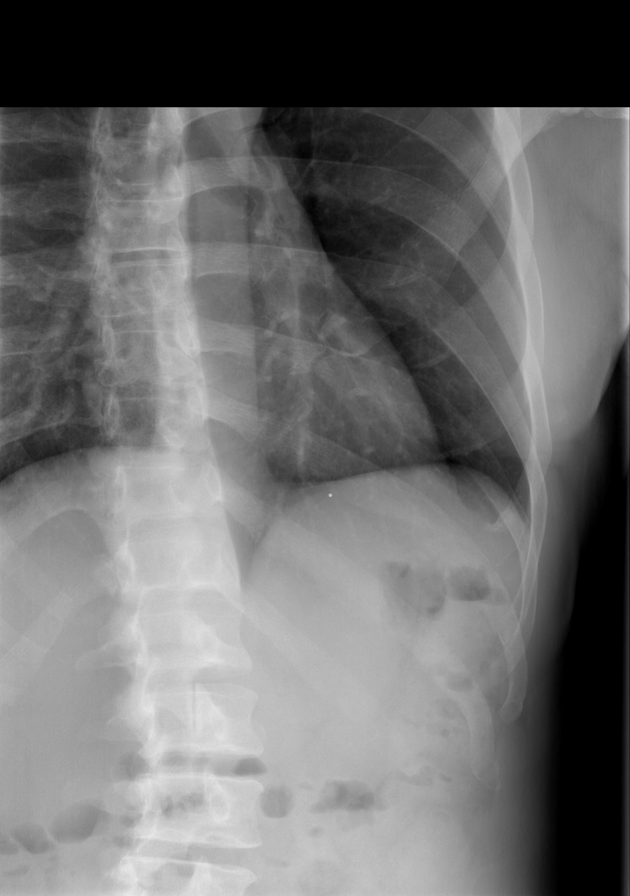

[2 of 2 positions shown; findings below may reference images not displayed]

FINDINGS: Lung volumes are normal.  No consolidative airspace
disease.  No pleural effusions.  No pneumothorax.  No pulmonary
nodule or mass noted.  Pulmonary vasculature and the
cardiomediastinal silhouette are within normal limits.  On the
lateral projection the marker placed over the inferior aspect of
the sternum is noted, but no definite underlying masses confidently
identified on this plain film examination.

Dedicated views of the left ribs demonstrate no acute displaced
fractures, and no definite bony mass.

Dedicated views of the right ribs demonstrate no definite displaced
fracture and no definite bony mass.
IMPRESSION: 1.  No soft tissue or bony mass is identified to correspond to the
palpable abnormality in the inferior aspect of the sternum.
2.  No radiographic evidence of acute cardiopulmonary disease.

## 2015-05-05 ENCOUNTER — Ambulatory Visit: Payer: Self-pay | Admitting: Endocrinology

## 2015-05-13 ENCOUNTER — Telehealth: Payer: Self-pay | Admitting: Endocrinology

## 2015-06-02 NOTE — Telephone Encounter (Signed)
Pt needed to talk to Nurse

## 2015-06-17 ENCOUNTER — Ambulatory Visit (INDEPENDENT_AMBULATORY_CARE_PROVIDER_SITE_OTHER): Payer: Self-pay | Admitting: Endocrinology

## 2015-06-17 ENCOUNTER — Telehealth: Payer: Self-pay | Admitting: Endocrinology

## 2015-06-17 ENCOUNTER — Other Ambulatory Visit: Payer: BLUE CROSS/BLUE SHIELD

## 2015-06-17 ENCOUNTER — Encounter: Payer: Self-pay | Admitting: Endocrinology

## 2015-06-17 VITALS — BP 112/70 | HR 112 | Temp 97.9°F | Ht 72.0 in | Wt 157.0 lb

## 2015-06-17 DIAGNOSIS — E119 Type 2 diabetes mellitus without complications: Secondary | ICD-10-CM | POA: Diagnosis not present

## 2015-06-17 DIAGNOSIS — E042 Nontoxic multinodular goiter: Secondary | ICD-10-CM

## 2015-06-17 DIAGNOSIS — R7309 Other abnormal glucose: Secondary | ICD-10-CM

## 2015-06-17 LAB — BASIC METABOLIC PANEL
BUN: 24 mg/dL — AB (ref 6–23)
CO2: 15 mEq/L — ABNORMAL LOW (ref 19–32)
Calcium: 10.2 mg/dL (ref 8.4–10.5)
Chloride: 90 mEq/L — ABNORMAL LOW (ref 96–112)
Creatinine, Ser: 1.43 mg/dL (ref 0.40–1.50)
GFR: 67.34 mL/min (ref >60.00)
GLUCOSE: 442 mg/dL — AB (ref 70–99)
POTASSIUM: 5.5 meq/L — AB (ref 3.5–5.1)
Sodium: 129 mEq/L — ABNORMAL LOW (ref 135–145)

## 2015-06-17 LAB — HEMOGLOBIN A1C
Hgb A1c MFr Bld: 19.8 % — ABNORMAL HIGH (ref <5.7)
MEAN PLASMA GLUCOSE: 522 mg/dL — AB (ref <117)

## 2015-06-17 LAB — TSH: TSH: 1.51 u[IU]/mL (ref 0.35–4.50)

## 2015-06-17 MED ORDER — INSULIN GLARGINE 100 UNIT/ML SOLOSTAR PEN: 10.0000 [IU] | PEN_INJECTOR | SUBCUTANEOUS | Status: DC

## 2015-06-17 NOTE — Telephone Encounter (Signed)
Pt advised of note below and voiced understanding.  

## 2015-06-17 NOTE — Telephone Encounter (Signed)
Pt called said he did want the insulin and asked if it could be called in

## 2015-06-17 NOTE — Patient Instructions (Addendum)
check your blood sugar 1 time a day.  vary the time of day when you check, between before the 3 meals, and at bedtime.  also check if you have symptoms of your blood sugar being too high or too low.  please keep a record of the readings and bring it to your next appointment here.  please call us sooner if your blood sugar goes below 70, or if it stays over 200.   Please come back for a follow-up appointment in 3 months.   blood tests are being requested for you today.  We'll contact you with results.   Without insulin, I believe you will land in the hospital eventually.  Please consider this carefully, and let me know if you decide to take it.

## 2015-06-17 NOTE — Telephone Encounter (Signed)
See note below and please advise, Thanks! 

## 2015-06-17 NOTE — Telephone Encounter (Signed)
please call patient: Due to blood tests, please increase lantus to 20 units qd.

## 2015-06-17 NOTE — Progress Notes (Signed)
Subjective:    Patient ID: Kenneth Durham, male    DOB: [birth year], 49 y.o.   MRN: [id number]  HPI Pt returns for f/u of diabetes mellitus: DM type: is presumed to be evolving type 1 (due to severe hyperglycemia and lean body habitus).   Dx'ed: 1700 Complications: none Therapy: trulicity+5 oral DM meds. DKA: never Severe hypoglycemia: never Pancreatitis: never Other: he refuses insulin, as he is a Pharmacist, community; due to lean body habitus and poor control with multiple orals, he is presumed to be evolving type 1 DM.  Interval history: no cbg record, but states cbg's vary from 200-400.  He takes only metformin and acarbose.  Hx is from pt and his mother.     Multinodular goiter was noted in 2008.  He was noted in early 2014 for the first time, to have a suppressed TSH.  i-131 was considered, but TSH was only slightly abnormal, so i decided to hold off.  Past Medical History  Diagnosis Date  . GOITER, NONTOXIC MULTINODULAR 09/22/2007  . DIABETES MELLITUS, TYPE II 09/13/2007  . FATTY LIVER DISEASE 10/29/2008  . DISEASE, CHRONIC NONALCOHOLIC LIVER NOS 17/03/9448  . HYPERCHOLESTEROLEMIA 01/15/2011    No past surgical history on file.  History   Social History  . Marital Status: Married    Spouse Name: N/A  . Number of Children: N/A  . Years of Education: N/A   Occupational History  . 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 Smoking status: Former Research scientist (life sciences)  . Smokeless tobacco: Not on file  . Alcohol Use: Not on file  . Drug Use: Not on file  . Sexual Activity: Not on file   Other Topics Concern  . Not on file   Social History Narrative    Current Outpatient Prescriptions on File Prior to Visit  Medication Sig Dispense Refill  . glucose blood (FREESTYLE LITE) test strip Use as instructed once daily dx 250.00     . chlorhexidine (PERIDEX) 0.12 % solution RINSE WITH 1/2 OZ. TWICE A DAY RINSE FOR 30 SECONDS AND SPIT. DO NOT SWALLOW  (Patient not taking: Reported on 02/04/2015) 473 mL 0  . ciclopirox (PENLAC) 8 % solution Apply topically at bedtime. Apply over nail and surrounding skin. Apply daily over previous coat. After seven (7) days, may remove with alcohol and continue cycle. (Patient not taking: Reported on 02/04/2015) 6.6 mL 11   No current facility-administered medications on file prior to visit.    No Known Allergies  Family History  Problem Relation Age of Onset  . Diabetes Other     1st degree relative  . Hypertension Other   . Heart disease Other     FH of Cardiac Murmur  . Cancer Other     Colon Cancer    BP 112/70 mmHg  Pulse 112  Temp(Src) 97.9 F (36.6 C) (Oral)  Ht 6' (1.829 m)  Wt 157 lb (71.215 kg)  BMI 21.29 kg/m2  SpO2 97%  Review of Systems He denies n/v/sob    Objective:   Physical Exam VITAL SIGNS:  See vs page.   GENERAL: no distress.  Pulses: dorsalis pedis intact bilat.   MSK: no deformity of the feet CV: no leg edema.  Skin:  no ulcer on the feet.  normal color and temp on the feet. Neuro: sensation is intact to touch on the feet.     Lab Results  Component Value Date  HGBA1C 17.6* 02/04/2015   Lab Results  Component Value Date   CREATININE 1.43 06/17/2015   BUN 24* 06/17/2015   NA 129* 06/17/2015   K 5.5* 06/17/2015   CL 90* 06/17/2015   CO2 15* 06/17/2015      Assessment & Plan:  DM: control continues to be poor.  He may be developing early DKA, but no need for hospitalization now, as he does not have n/v/sob.     Patient is advised the following: Patient Instructions  check your blood sugar 1 time a day.  vary the time of day when you check, between before the 3 meals, and at bedtime.  also check if you have symptoms of your blood sugar being too high or too low.  please keep a record of the readings and bring it to your next appointment here.  please call us sooner if your blood sugar goes below 70, or if it stays over 200.   Please come back for a  follow-up appointment in 3 months.   blood tests are being requested for you today.  We'll contact you with results.   Without insulin, I believe you will land in the hospital eventually.  Please consider this carefully, and let me know if you decide to take it.    Addendum: i have sent a prescription to your pharmacy, to start insulin.  Call in a few days to report cbg's.

## 2015-06-17 NOTE — Telephone Encounter (Signed)
Ok, i have sent a prescription to your pharmacy, to start 10 units each morning. This replaces the diabetes pill and the trulicity. Please call us next week, to tell us how the blood sugar is doing. Please come back for a follow-up appointment in 2 months.

## 2015-06-18 DIAGNOSIS — E1110 Type 2 diabetes mellitus with ketoacidosis without coma: Secondary | ICD-10-CM | POA: Insufficient documentation

## 2015-06-18 DIAGNOSIS — E875 Hyperkalemia: Secondary | ICD-10-CM | POA: Insufficient documentation

## 2015-06-18 NOTE — Telephone Encounter (Signed)
I was able to contact the pt and advised of instructions. Pt will begin taking 20 units of Lantus.

## 2015-06-18 NOTE — Telephone Encounter (Signed)
I tried to contact the pt. Pt was not avalible. I will try at another time.

## 2015-06-18 NOTE — Telephone Encounter (Signed)
Requested call back from the pt to discuss.

## 2015-06-25 ENCOUNTER — Telehealth: Payer: Self-pay | Admitting: Endocrinology

## 2015-06-25 NOTE — Telephone Encounter (Signed)
Please see below.

## 2015-06-25 NOTE — Telephone Encounter (Signed)
Pt stated that he has an appt with his PCP on Friday at 2pm and he will bring the swelling to his attention.

## 2015-06-25 NOTE — Telephone Encounter (Signed)
This is an uncommon side-effect.  Please see your pcp for this.

## 2015-06-25 NOTE — Telephone Encounter (Signed)
See below

## 2015-06-25 NOTE — Telephone Encounter (Signed)
Patient called stating that he is currently having swelling; believes its from his insulin?   Please advise patient   Thank you

## 2015-06-26 ENCOUNTER — Telehealth: Payer: Self-pay | Admitting: *Deleted

## 2015-06-26 NOTE — Telephone Encounter (Signed)
Unable to reach patient at time of Pre-Visit Call.  Left message for patient to return call when available.    

## 2015-06-27 ENCOUNTER — Encounter: Payer: Self-pay | Admitting: Endocrinology

## 2015-06-27 ENCOUNTER — Ambulatory Visit (INDEPENDENT_AMBULATORY_CARE_PROVIDER_SITE_OTHER): Payer: BLUE CROSS/BLUE SHIELD | Admitting: Endocrinology

## 2015-06-27 ENCOUNTER — Encounter: Payer: Self-pay | Admitting: Family Medicine

## 2015-06-27 ENCOUNTER — Telehealth: Payer: Self-pay | Admitting: Endocrinology

## 2015-06-27 ENCOUNTER — Ambulatory Visit (INDEPENDENT_AMBULATORY_CARE_PROVIDER_SITE_OTHER): Payer: BLUE CROSS/BLUE SHIELD | Admitting: Family Medicine

## 2015-06-27 VITALS — BP 114/70 | HR 87 | Temp 97.9°F | Ht 72.0 in | Wt 187.0 lb

## 2015-06-27 VITALS — BP 118/70 | HR 76 | Temp 98.7°F | Resp 18 | Ht 71.5 in | Wt 186.0 lb

## 2015-06-27 DIAGNOSIS — E108 Type 1 diabetes mellitus with unspecified complications: Principal | ICD-10-CM

## 2015-06-27 DIAGNOSIS — E1069 Type 1 diabetes mellitus with other specified complication: Secondary | ICD-10-CM | POA: Diagnosis not present

## 2015-06-27 DIAGNOSIS — E785 Hyperlipidemia, unspecified: Secondary | ICD-10-CM

## 2015-06-27 DIAGNOSIS — E109 Type 1 diabetes mellitus without complications: Secondary | ICD-10-CM | POA: Diagnosis not present

## 2015-06-27 DIAGNOSIS — E042 Nontoxic multinodular goiter: Secondary | ICD-10-CM | POA: Diagnosis not present

## 2015-06-27 DIAGNOSIS — E1065 Type 1 diabetes mellitus with hyperglycemia: Secondary | ICD-10-CM

## 2015-06-27 DIAGNOSIS — IMO0002 Reserved for concepts with insufficient information to code with codable children: Secondary | ICD-10-CM

## 2015-06-27 LAB — BASIC METABOLIC PANEL
BUN: 15 mg/dL (ref 6–23)
CALCIUM: 8.3 mg/dL — AB (ref 8.4–10.5)
CO2: 27 mEq/L (ref 19–32)
Chloride: 104 mEq/L (ref 96–112)
Creat: 0.76 mg/dL (ref 0.50–1.35)
Glucose, Bld: 160 mg/dL — ABNORMAL HIGH (ref 70–99)
Potassium: 4.2 mEq/L (ref 3.5–5.3)
SODIUM: 140 meq/L (ref 135–145)

## 2015-06-27 LAB — HEPATIC FUNCTION PANEL
ALT: 43 U/L (ref 0–53)
AST: 38 U/L — ABNORMAL HIGH (ref 0–37)
Albumin: 3.3 g/dL — ABNORMAL LOW (ref 3.5–5.2)
Alkaline Phosphatase: 73 U/L (ref 39–117)
BILIRUBIN DIRECT: 0.1 mg/dL (ref 0.0–0.3)
BILIRUBIN TOTAL: 0.3 mg/dL (ref 0.2–1.2)
Indirect Bilirubin: 0.2 mg/dL (ref 0.2–1.2)
Total Protein: 5.7 g/dL — ABNORMAL LOW (ref 6.0–8.3)

## 2015-06-27 LAB — LIPID PANEL
Cholesterol: 198 mg/dL (ref 0–200)
HDL: 97 mg/dL (ref >=40)
LDL Cholesterol: 95 mg/dL (ref 0–99)
TRIGLYCERIDES: 31 mg/dL (ref <150)
Total CHOL/HDL Ratio: 2 Ratio
VLDL: 6 mg/dL (ref 0–40)

## 2015-06-27 MED ORDER — INSULIN GLARGINE 100 UNIT/ML SOLOSTAR PEN: 25.0000 [IU] | PEN_INJECTOR | Freq: Every day | SUBCUTANEOUS | Status: DC

## 2015-06-27 MED ORDER — INSULIN ASPART 100 UNIT/ML FLEXPEN: 8.0000 [IU] | PEN_INJECTOR | Freq: Three times a day (TID) | SUBCUTANEOUS | Status: DC

## 2015-06-27 MED ORDER — GLUCOSE BLOOD VI STRP: ORAL_STRIP | Status: DC

## 2015-06-27 NOTE — Progress Notes (Signed)
Pre visit review using our clinic review tool, if applicable. No additional management support is needed unless otherwise documented below in the visit note. 

## 2015-06-27 NOTE — Patient Instructions (Signed)

## 2015-06-27 NOTE — Assessment & Plan Note (Signed)
Per endo °

## 2015-06-27 NOTE — Progress Notes (Signed)
Patient ID: Kenneth Durham, male    DOB: 02-13-1966  Age: 49 y.o. MRN: [id number]    Subjective:  Subjective HPI ANSELM AUMILLER presents to establish--- pt see endo for his dM,  He also needs f/u cholesterol and bp.    Review of Systems  Constitutional: Negative for diaphoresis, appetite change, fatigue and unexpected weight change.  Eyes: Negative for pain, redness and visual disturbance.  Respiratory: Negative for cough, chest tightness, shortness of breath and wheezing.   Cardiovascular: Negative for chest pain, palpitations and leg swelling.  Endocrine: Negative for cold intolerance, heat intolerance, polydipsia, polyphagia and polyuria.  Genitourinary: Negative for dysuria, frequency and difficulty urinating.  Neurological: Negative for dizziness, light-headedness, numbness and headaches.    History Past Medical History  Diagnosis Date  . GOITER, NONTOXIC MULTINODULAR 09/22/2007  . DIABETES MELLITUS, TYPE II 09/13/2007  . FATTY LIVER DISEASE 10/29/2008  . DISEASE, CHRONIC NONALCOHOLIC LIVER NOS 33/0/0762    He has no past surgical history on file.   His family history includes Cancer in his other; Diabetes in his other; Heart disease in his other; Hypertension in his other.He reports that he has quit smoking. He does not have any smokeless tobacco history on file. He reports that he does not drink alcohol or use illicit drugs.  Current Outpatient Prescriptions on File Prior to Visit  Medication Sig Dispense Refill  . glucose blood (FREESTYLE LITE) test strip Use as instructed once daily dx 250.00     . insulin aspart (NOVOLOG FLEXPEN) 100 UNIT/ML FlexPen Inject 8 Units into the skin 3 (three) times daily with meals. And pen needles 4/day 15 mL 11  . Insulin Glargine (LANTUS SOLOSTAR) 100 UNIT/ML Solostar Pen Inject 25 Units into the skin at bedtime. And pen needles 1/day 5 pen PRN  . ciclopirox (PENLAC) 8 % solution Apply topically at bedtime. Apply over nail and  surrounding skin. Apply daily over previous coat. After seven (7) days, may remove with alcohol and continue cycle. (Patient not taking: Reported on 06/27/2015) 6.6 mL 11   No current facility-administered medications on file prior to visit.     Objective:  Objective Physical Exam  Constitutional: He is oriented to person, place, and time. Vital signs are normal. He appears well-developed and well-nourished. He is sleeping.  HENT:  Head: Normocephalic and atraumatic.  Mouth/Throat: Oropharynx is clear and moist.  Eyes: EOM are normal. Pupils are equal, round, and reactive to light.  Neck: Normal range of motion. Neck supple. No thyromegaly present.  Cardiovascular: Normal rate and regular rhythm.   No murmur heard. Pulmonary/Chest: Effort normal and breath sounds normal. No respiratory distress. He has no wheezes. He has no rales. He exhibits no tenderness.  Musculoskeletal: He exhibits no edema or tenderness.  Neurological: He is alert and oriented to person, place, and time.  Skin: Skin is warm and dry.  Psychiatric: He has a normal mood and affect. His behavior is normal. Judgment and thought content normal.   BP 118/70 mmHg  Pulse 76  Temp(Src) 98.7 F (37.1 C) (Oral)  Resp 18  Ht 5' 11.5" (1.816 m)  Wt 186 lb (84.369 kg)  BMI 25.58 kg/m2  SpO2 98% Wt Readings from Last 3 Encounters:  06/27/15 186 lb (84.369 kg)  06/27/15 187 lb (84.823 kg)  06/17/15 157 lb (71.215 kg)     Lab Results  Component Value Date   GLUCOSE 442* 06/17/2015   CHOL 219* 11/04/2014   TRIG 73.0 11/04/2014   HDL 68.90  11/04/2014   LDLDIRECT 153.5 01/15/2011   LDLCALC 136* 11/04/2014   ALT 26 11/04/2014   AST 17 11/04/2014   NA 129* 06/17/2015   K 5.5* 06/17/2015   CL 90* 06/17/2015   CREATININE 1.43 06/17/2015   BUN 24* 06/17/2015   CO2 15* 06/17/2015   TSH 1.51 06/17/2015   HGBA1C 19.8* 06/17/2015   MICROALBUR 23.6* 04/01/2014    Dg Chest 2 View  06/18/2013   *RADIOLOGY REPORT*   Clinical Data: Mass in the inferior aspect of the sternum.  CHEST - 2 VIEW,LEFT RIBS - 2 VIEW,RIGHT RIBS - 2 VIEW  Comparison: Chest x-ray 01/27/2009.  Findings: Lung volumes are normal.  No consolidative airspace disease.  No pleural effusions.  No pneumothorax.  No pulmonary nodule or mass noted.  Pulmonary vasculature and the cardiomediastinal silhouette are within normal limits.  On the lateral projection the marker placed over the inferior aspect of the sternum is noted, but no definite underlying masses confidently identified on this plain film examination.  Dedicated views of the left ribs demonstrate no acute displaced fractures, and no definite bony mass.  Dedicated views of the right ribs demonstrate no definite displaced fracture and no definite bony mass.  IMPRESSION: 1.  No soft tissue or bony mass is identified to correspond to the palpable abnormality in the inferior aspect of the sternum. 2.  No radiographic evidence of acute cardiopulmonary disease.   Original Report Authenticated By: Vinnie Langton, M.D.   Dg Ribs Unilateral Left  06/18/2013   *RADIOLOGY REPORT*  Clinical Data: Mass in the inferior aspect of the sternum.  CHEST - 2 VIEW,LEFT RIBS - 2 VIEW,RIGHT RIBS - 2 VIEW  Comparison: Chest x-ray 01/27/2009.  Findings: Lung volumes are normal.  No consolidative airspace disease.  No pleural effusions.  No pneumothorax.  No pulmonary nodule or mass noted.  Pulmonary vasculature and the cardiomediastinal silhouette are within normal limits.  On the lateral projection the marker placed over the inferior aspect of the sternum is noted, but no definite underlying masses confidently identified on this plain film examination.  Dedicated views of the left ribs demonstrate no acute displaced fractures, and no definite bony mass.  Dedicated views of the right ribs demonstrate no definite displaced fracture and no definite bony mass.  IMPRESSION: 1.  No soft tissue or bony mass is identified to  correspond to the palpable abnormality in the inferior aspect of the sternum. 2.  No radiographic evidence of acute cardiopulmonary disease.   Original Report Authenticated By: Vinnie Langton, M.D.   Dg Ribs Unilateral Right  06/18/2013   *RADIOLOGY REPORT*  Clinical Data: Mass in the inferior aspect of the sternum.  CHEST - 2 VIEW,LEFT RIBS - 2 VIEW,RIGHT RIBS - 2 VIEW  Comparison: Chest x-ray 01/27/2009.  Findings: Lung volumes are normal.  No consolidative airspace disease.  No pleural effusions.  No pneumothorax.  No pulmonary nodule or mass noted.  Pulmonary vasculature and the cardiomediastinal silhouette are within normal limits.  On the lateral projection the marker placed over the inferior aspect of the sternum is noted, but no definite underlying masses confidently identified on this plain film examination.  Dedicated views of the left ribs demonstrate no acute displaced fractures, and no definite bony mass.  Dedicated views of the right ribs demonstrate no definite displaced fracture and no definite bony mass.  IMPRESSION: 1.  No soft tissue or bony mass is identified to correspond to the palpable abnormality in the inferior aspect of the sternum.  2.  No radiographic evidence of acute cardiopulmonary disease.   Original Report Authenticated By: Vinnie Langton, M.D.     Assessment & Plan:  Plan I have discontinued Mr. Mazur's chlorhexidine. I am also having him maintain his glucose blood, ciclopirox, Insulin Glargine, and insulin aspart.  No orders of the defined types were placed in this encounter.    Problem List Items Addressed This Visit    None    Visit Diagnoses    Type I diabetes mellitus with complication, uncontrolled    -  Primary    Relevant Orders    Ambulatory referral to diabetic education    Lipid panel    Hepatic function panel    Basic Metabolic Panel (BMET)    Hyperlipidemia LDL goal <70        Relevant Orders    Lipid panel    Hepatic function panel     Basic Metabolic Panel (BMET)       Follow-up: Return in about 3 months (around 09/27/2015), or if symptoms worsen or fail to improve, for annual exam, fasting.  Garnet Koyanagi, DO

## 2015-06-27 NOTE — Patient Instructions (Addendum)
check your blood sugar 4 times a day: before the 3 meals, and at bedtime.  also check if you have symptoms of your blood sugar being too high or too low.  please keep a record of the readings and bring it to your next appointment here.  You can write it on any piece of paper.  please call us sooner if your blood sugar goes below 70, or if you have a lot of readings over 200. Please continue lantus, 25 units at bedtime, and: Increase the novolog to 8 units 3 times a day (just before each meal).  Please take this no matter what your blood sugar is.  You don't need to take extra humalog.  Please come back for a follow-up appointment in 2 weeks.  The swelling of your legs will get better with time.

## 2015-06-27 NOTE — Telephone Encounter (Signed)
[facility] note dated 06/27/15 8:02 am: Initial Comment Caller states has 8am appt; about 15 mins late; called [facility]/comes to [facility]; called [facility]/Stephanie; gave info; General Information Type Appointment

## 2015-06-27 NOTE — Progress Notes (Signed)
Subjective:    Patient ID: Kenneth Durham, male    DOB: [birth year], 49 y.o.   MRN: [id number]  HPI Pt returns for f/u of diabetes mellitus: DM type: is presumed to be evolving type 1 (due to severe hyperglycemia and lean body habitus).   Dx'ed: 4098 Complications: none Therapy: insulin since mid-2016 DKA: never Severe hypoglycemia: never Pancreatitis: never Other: due to lean body habitus and poor control with multiple orals, he is presumed to be evolving type 1 DM.  Interval history: He was seen at [facility] last week, with DKA.  lantus was increased to 25 units qd.  He also takes prn humalog (averages a total of approx 18 units per day).  no cbg record, but states cbg's vary from 147-250.  There is no trend throughout the day.        Multinodular goiter was noted in 2008.  He was noted in early 2014 for the first time, to have a suppressed TSH.  i-131 was considered, but TSH was only slightly abnormal, so i decided to hold off.  Past Medical History  Diagnosis Date  . GOITER, NONTOXIC MULTINODULAR 09/22/2007  . DIABETES MELLITUS, TYPE II 09/13/2007  . FATTY LIVER DISEASE 10/29/2008  . DISEASE, CHRONIC NONALCOHOLIC LIVER NOS 10/28/1477  . HYPERCHOLESTEROLEMIA 01/15/2011    No past surgical history on file.  History   Social History  . Marital Status: Married    Spouse Name: N/A  . Number of Children: N/A  . Years of Education: N/A   Occupational History  . 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 Smoking status: Former Research scientist (life sciences)  . Smokeless tobacco: Not on file  . Alcohol Use: Not on file  . Drug Use: Not on file  . Sexual Activity: Not on file   Other Topics Concern  . Not on file   Social History Narrative    Current Outpatient Prescriptions on File Prior to Visit  Medication Sig Dispense Refill  . chlorhexidine (PERIDEX) 0.12 % solution RINSE WITH 1/2 OZ. TWICE A DAY RINSE FOR 30 SECONDS AND SPIT. DO NOT SWALLOW 473  mL 0  . ciclopirox (PENLAC) 8 % solution Apply topically at bedtime. Apply over nail and surrounding skin. Apply daily over previous coat. After seven (7) days, may remove with alcohol and continue cycle. 6.6 mL 11  . glucose blood (FREESTYLE LITE) test strip Use as instructed once daily dx 250.00      No current facility-administered medications on file prior to visit.    No Known Allergies  Family History  Problem Relation Age of Onset  . Diabetes Other     1st degree relative  . Hypertension Other   . Heart disease Other     FH of Cardiac Murmur  . Cancer Other     Colon Cancer    BP 114/70 mmHg  Pulse 87  Temp(Src) 97.9 F (36.6 C) (Oral)  Ht 6' (1.829 m)  Wt 187 lb (84.823 kg)  BMI 25.36 kg/m2  SpO2 97%  Review of Systems He denies hypoglycemia.  He has gained weight.      Objective:   Physical Exam VITAL SIGNS:  See vs page GENERAL: no distress.   SKIN:  Insulin injection sites at the anterior abdomen are normal.  Pulses: dorsalis pedis intact bilat.   MSK: no deformity of the feet CV: 1+ bilat leg edema Skin:  no ulcer on the feet.  normal  color and temp on the feet. Neuro: sensation is intact to touch on the feet   i reviewed records from [facility]    Assessment & Plan:  Type 1 DM: he needs increased rx Refeeding edema, new.  We'll follow.    Patient is advised the following: Patient Instructions  check your blood sugar 4 times a day: before the 3 meals, and at bedtime.  also check if you have symptoms of your blood sugar being too high or too low.  please keep a record of the readings and bring it to your next appointment here.  You can write it on any piece of paper.  please call us sooner if your blood sugar goes below 70, or if you have a lot of readings over 200. Please continue lantus, 25 units at bedtime, and: Increase the novolog to 8 units 3 times a day (just before each meal).  Please take this no matter what your blood sugar is.  You don't  need to take extra humalog.  Please come back for a follow-up appointment in 2 weeks.  The swelling of your legs will get better with time.

## 2015-06-27 NOTE — Telephone Encounter (Signed)
Noted  

## 2015-06-30 ENCOUNTER — Telehealth: Payer: Self-pay | Admitting: *Deleted

## 2015-06-30 NOTE — Telephone Encounter (Signed)
Prior authorization for Verio test strips initiated. Awaiting determination. [id number]

## 2015-06-30 NOTE — Telephone Encounter (Signed)
PA not required, Verio is preferred product. JG//CMA

## 2015-07-07 ENCOUNTER — Ambulatory Visit: Payer: Self-pay | Admitting: Endocrinology

## 2015-07-11 ENCOUNTER — Encounter: Payer: Self-pay | Admitting: Endocrinology

## 2015-07-11 ENCOUNTER — Ambulatory Visit (INDEPENDENT_AMBULATORY_CARE_PROVIDER_SITE_OTHER): Payer: BLUE CROSS/BLUE SHIELD | Admitting: Endocrinology

## 2015-07-11 VITALS — BP 118/80 | HR 89 | Temp 98.5°F | Resp 16 | Ht 72.0 in | Wt 188.0 lb

## 2015-07-11 DIAGNOSIS — E109 Type 1 diabetes mellitus without complications: Secondary | ICD-10-CM | POA: Diagnosis not present

## 2015-07-11 NOTE — Patient Instructions (Addendum)
check your blood sugar 4 times a day: before the 3 meals, and at bedtime.  also check if you have symptoms of your blood sugar being too high or too low.  please keep a record of the readings and bring it to your next appointment here.  You can write it on any piece of paper.  please call us sooner if your blood sugar goes below 70, or if you have a lot of readings over 200. Please continue lantus, 25 units at bedtime, and: continue the novolog, 8 units 3 times a day (just before each meal).  Please take this no matter what your blood sugar is.   It is important to never forget the insulin Please come back for a follow-up appointment in 2 months.

## 2015-07-11 NOTE — Progress Notes (Signed)
Subjective:    Patient ID: Kenneth Durham, male    DOB: 06/26/1966, 49 y.o.   MRN: [id number]  HPI Pt returns for f/u of diabetes mellitus: DM type: 1   Dx'ed: 4562 Complications: none.  Therapy: insulin since mid-2016.  DKA: once, in early 2016 Severe hypoglycemia: never.  Pancreatitis: never.  Other: he takes multiple daily injections Interval history:  Pt says he sometimes forgets the insulin.  no cbg record, but states cbg's are well-controlled, when he takes it.  pt states he feels well in general. Multinodular goiter was noted in 2008.  He was noted in early 2014 for the first time, to have a suppressed TSH.  i-131 was considered, but TSH was only slightly abnormal, so i decided to hold off.   Past Medical History  Diagnosis Date  . GOITER, NONTOXIC MULTINODULAR 09/22/2007  . DIABETES MELLITUS, TYPE II 09/13/2007  . FATTY LIVER DISEASE 10/29/2008  . DISEASE, CHRONIC NONALCOHOLIC LIVER NOS 56/02/8936    No past surgical history on file.  History   Social History  . Marital Status: Married    Spouse Name: N/A  . Number of Children: N/A  . Years of Education: N/A   Occupational History  . 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 Smoking status: Former Research scientist (life sciences)  . Smokeless tobacco: Not on file  . Alcohol Use: No  . Drug Use: No  . Sexual Activity: Not on file   Other Topics Concern  . Not on file   Social History Narrative    Current Outpatient Prescriptions on File Prior to Visit  Medication Sig Dispense Refill  . ciclopirox (PENLAC) 8 % solution Apply topically at bedtime. Apply over nail and surrounding skin. Apply daily over previous coat. After seven (7) days, may remove with alcohol and continue cycle. 6.6 mL 11  . glucose blood test strip Use as instructed bid 100 each 12  . insulin aspart (NOVOLOG FLEXPEN) 100 UNIT/ML FlexPen Inject 8 Units into the skin 3 (three) times daily with meals. And pen needles 4/day  15 mL 11  . Insulin Glargine (LANTUS SOLOSTAR) 100 UNIT/ML Solostar Pen Inject 25 Units into the skin at bedtime. And pen needles 1/day 5 pen PRN   No current facility-administered medications on file prior to visit.    Allergies  Allergen Reactions  . Shrimp [Shellfish Allergy] Hives and Swelling    Family History  Problem Relation Age of Onset  . Diabetes Other     1st degree relative  . Hypertension Other   . Heart disease Other     FH of Cardiac Murmur  . Cancer Other     Colon Cancer    BP 118/80 mmHg  Pulse 89  Temp(Src) 98.5 F (36.9 C) (Oral)  Resp 16  Ht 6' (1.829 m)  Wt 188 lb (85.276 kg)  BMI 25.49 kg/m2  SpO2 95%  Review of Systems Edema is resolved.  He denies hypoglycemia.      Objective:   Physical Exam VITAL SIGNS:  See vs page GENERAL: no distress Pulses: dorsalis pedis intact bilat.   MSK: no deformity of the feet CV: no leg edema Skin:  no ulcer on the feet.  normal color and temp on the feet. Neuro: sensation is intact to touch on the feet.     Assessment & Plan:  DM: control is improved.   Noncompliance with cbg recording and insulin: I'll work around this  as best I can  Patient is advised the following: Patient Instructions  check your blood sugar 4 times a day: before the 3 meals, and at bedtime.  also check if you have symptoms of your blood sugar being too high or too low.  please keep a record of the readings and bring it to your next appointment here.  You can write it on any piece of paper.  please call us sooner if your blood sugar goes below 70, or if you have a lot of readings over 200. Please continue lantus, 25 units at bedtime, and: continue the novolog, 8 units 3 times a day (just before each meal).  Please take this no matter what your blood sugar is.   It is important to never forget the insulin Please come back for a follow-up appointment in 2 months.

## 2015-07-22 ENCOUNTER — Encounter: Payer: BLUE CROSS/BLUE SHIELD | Attending: Endocrinology | Admitting: Nutrition

## 2015-07-22 DIAGNOSIS — Z794 Long term (current) use of insulin: Secondary | ICD-10-CM | POA: Insufficient documentation

## 2015-07-22 DIAGNOSIS — E08319 Diabetes mellitus due to underlying condition with unspecified diabetic retinopathy without macular edema: Secondary | ICD-10-CM | POA: Insufficient documentation

## 2015-07-22 DIAGNOSIS — Z713 Dietary counseling and surveillance: Secondary | ICD-10-CM | POA: Diagnosis not present

## 2015-07-23 NOTE — Patient Instructions (Signed)
Have at least 2 servings of carbohydrate with each meal Test blood sugars before meals and at bedtime Call results to me in one week.  [phone] [phone]

## 2015-07-23 NOTE — Progress Notes (Signed)
Mr. Primeau is here to review blood sugar readings.  He did not bring his meter.  He works nights, gets home at [employer].  Will eat something and take 8u of Novolog.  He ususally goes to sleep until 1PM, wakes, eats again at 2, and goes back to bed at 4PM until 9PM, he he eats his bigger meal.  He will take 8u for all of these meals.   Says FBSs (1PM) are ususally good--less than 150.  He says they were 109 this AM.   He eats very little carbs at his meals--between 10-30.  Because of this, he gets hungry and will snack night on raw veg., and large amount of nuts-1-2 cups.  He is also eating a mixture of 2 cups of nuts with molasses as a snack.   Explained the idea of a balanced meal with carbohydrates, protein and fats, and how each works to give him energy from 30 min. after a meal, until 5-6 hours later--when his Novolog is working.  Discussed the fact that he is eating not eating enough carbs for his activity at work, and that he is making his hungry while working.  Suggested adding 2-3 servings of carbs to all meals.  He was given a list of carb and protein serving sizes.  He was also told limit the amounts of protein to 2 servings at his breakfast time, 3 ounces at lunch and 4-5 ounces at supper/snack during work.  He agreed to try this and to test his blood sugars before all meals and at bedtime and call me in one week with blood sugar readings. He had no final questions

## 2015-09-01 ENCOUNTER — Other Ambulatory Visit: Payer: Self-pay

## 2015-09-01 MED ORDER — INSULIN GLARGINE 100 UNIT/ML SOLOSTAR PEN: 25.0000 [IU] | PEN_INJECTOR | Freq: Every day | SUBCUTANEOUS | Status: DC

## 2015-09-11 ENCOUNTER — Encounter: Payer: Self-pay | Admitting: Endocrinology

## 2015-09-11 ENCOUNTER — Ambulatory Visit (INDEPENDENT_AMBULATORY_CARE_PROVIDER_SITE_OTHER): Payer: BLUE CROSS/BLUE SHIELD | Admitting: Endocrinology

## 2015-09-11 VITALS — BP 132/94 | HR 90 | Temp 98.2°F | Ht 72.0 in | Wt 203.0 lb

## 2015-09-11 DIAGNOSIS — E08319 Diabetes mellitus due to underlying condition with unspecified diabetic retinopathy without macular edema: Secondary | ICD-10-CM | POA: Diagnosis not present

## 2015-09-11 LAB — POCT GLYCOSYLATED HEMOGLOBIN (HGB A1C): Hemoglobin A1C: 8.4

## 2015-09-11 MED ORDER — INSULIN GLARGINE 100 UNIT/ML SOLOSTAR PEN: 30.0000 [IU] | PEN_INJECTOR | Freq: Every day | SUBCUTANEOUS | Status: DC

## 2015-09-11 NOTE — Progress Notes (Signed)
Subjective:    Patient ID: Kenneth Durham, male    DOB: July 04, 1966, 49 y.o.   MRN: [id number]  HPI Pt returns for f/u of diabetes mellitus: DM type: 1   Dx'ed: 6761 Complications: none.  Therapy: insulin since mid-2016.  DKA: once, in early 2016 Severe hypoglycemia: never.  Pancreatitis: never.  Other: he takes multiple daily injections; he works 3rd shift.   Interval history:  no cbg record, but states cbg's are mildly low approx once per month.  This happens after a smaller-than-expected meal, or with activity.  Otherwise, There is no trend throughout the day. Multinodular goiter was noted in 2008.  He was noted in early 2014 for the first time, to have a suppressed TSH.  i-131 was considered, but TSH was only slightly abnormal, so we decided to hold off.  Past Medical History  Diagnosis Date  . GOITER, NONTOXIC MULTINODULAR 09/22/2007  . DIABETES MELLITUS, TYPE II 09/13/2007  . FATTY LIVER DISEASE 10/29/2008  . DISEASE, CHRONIC NONALCOHOLIC LIVER NOS 95/0/9326    No past surgical history on file.  Social History   Social History  . Marital Status: Married    Spouse Name: N/A  . Number of Children: N/A  . Years of Education: N/A   Occupational History  . 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 Smoking status: Former Research scientist (life sciences)  . Smokeless tobacco: Not on file  . Alcohol Use: No  . Drug Use: No  . Sexual Activity: Not on file   Other Topics Concern  . Not on file   Social History Narrative    Current Outpatient Prescriptions on File Prior to Visit  Medication Sig Dispense Refill  . glucose blood test strip Use as instructed bid 100 each 12  . insulin aspart (NOVOLOG FLEXPEN) 100 UNIT/ML FlexPen Inject 8 Units into the skin 3 (three) times daily with meals. And pen needles 4/day 15 mL 11   No current facility-administered medications on file prior to visit.    Allergies  Allergen Reactions  . Shrimp [Shellfish  Allergy] Hives and Swelling    Family History  Problem Relation Age of Onset  . Diabetes Other     1st degree relative  . Hypertension Other   . Heart disease Other     FH of Cardiac Murmur  . Cancer Other     Colon Cancer    BP 132/94 mmHg  Pulse 90  Temp(Src) 98.2 F (36.8 C) (Oral)  Ht 6' (1.829 m)  Wt 203 lb (92.08 kg)  BMI 27.53 kg/m2  SpO2 96%  Review of Systems Denies LOC    Objective:   Physical Exam VITAL SIGNS:  See vs page GENERAL: no distress Pulses: dorsalis pedis intact bilat.   MSK: no deformity of the feet CV: no leg edema Skin:  no ulcer on the feet.  normal color and temp on the feet. Neuro: sensation is intact to touch on the feet.     A1c=8.4%    Assessment & Plan:  DM: control is improved, but he needs increased rx  Patient is advised the following: Patient Instructions  check your blood sugar 4 times a day: before the 3 meals, and at bedtime.  also check if you have symptoms of your blood sugar being too high or too low.  please keep a record of the readings and bring it to your next appointment here.  You can write it on any  piece of paper.  please call us sooner if your blood sugar goes below 70, or if you have a lot of readings over 200. Please increase the lantus to 30 units at daily, and: take the novolog, 5-8 units 3 times a day (just before each meal).  Please take this no matter what your blood sugar is.  You should vary it according to the size of the meal, and if exercise is upcoming.  If you are unable to anticipate the exercise, eat a light snack with it.   Please come back for a follow-up appointment in 2-3 months.

## 2015-09-11 NOTE — Patient Instructions (Addendum)
check your blood sugar 4 times a day: before the 3 meals, and at bedtime.  also check if you have symptoms of your blood sugar being too high or too low.  please keep a record of the readings and bring it to your next appointment here.  You can write it on any piece of paper.  please call us sooner if your blood sugar goes below 70, or if you have a lot of readings over 200. Please increase the lantus to 30 units at daily, and: take the novolog, 5-8 units 3 times a day (just before each meal).  Please take this no matter what your blood sugar is.  You should vary it according to the size of the meal, and if exercise is upcoming.  If you are unable to anticipate the exercise, eat a light snack with it.   Please come back for a follow-up appointment in 2-3 months.

## 2015-09-16 ENCOUNTER — Telehealth: Payer: Self-pay | Admitting: Endocrinology

## 2015-09-16 NOTE — Telephone Encounter (Signed)
Patient would like his lab results  ° °Please advise  ° °Thank you  °

## 2015-09-16 NOTE — Telephone Encounter (Signed)
Kenneth Durham,   This is your patient.  Thanks,  Suanne Marker

## 2015-09-16 NOTE — Telephone Encounter (Signed)
I called the patient and advised via voicemail the only blood test preformed was a POCT A1C in the office. The result was 8.4.

## 2015-10-02 ENCOUNTER — Telehealth: Payer: Self-pay | Admitting: Behavioral Health

## 2015-10-02 NOTE — Telephone Encounter (Signed)
Unable to reach patient at time of Pre-Visit Call.  Left message for patient to return call when available.    

## 2015-10-03 ENCOUNTER — Encounter: Payer: Self-pay | Admitting: Gastroenterology

## 2015-10-03 ENCOUNTER — Encounter: Payer: Self-pay | Admitting: Family Medicine

## 2015-10-03 ENCOUNTER — Ambulatory Visit (INDEPENDENT_AMBULATORY_CARE_PROVIDER_SITE_OTHER): Payer: BLUE CROSS/BLUE SHIELD | Admitting: Family Medicine

## 2015-10-03 VITALS — BP 132/80 | HR 81 | Temp 98.1°F | Ht 72.0 in | Wt 200.4 lb

## 2015-10-03 DIAGNOSIS — Z0000 Encounter for general adult medical examination without abnormal findings: Secondary | ICD-10-CM | POA: Diagnosis not present

## 2015-10-03 DIAGNOSIS — E1139 Type 2 diabetes mellitus with other diabetic ophthalmic complication: Secondary | ICD-10-CM | POA: Diagnosis not present

## 2015-10-03 DIAGNOSIS — Z794 Long term (current) use of insulin: Secondary | ICD-10-CM

## 2015-10-03 DIAGNOSIS — M79661 Pain in right lower leg: Secondary | ICD-10-CM | POA: Diagnosis not present

## 2015-10-03 DIAGNOSIS — E119 Type 2 diabetes mellitus without complications: Secondary | ICD-10-CM | POA: Diagnosis not present

## 2015-10-03 DIAGNOSIS — E1165 Type 2 diabetes mellitus with hyperglycemia: Secondary | ICD-10-CM

## 2015-10-03 HISTORY — DX: Encounter for general adult medical examination without abnormal findings: Z00.00

## 2015-10-03 LAB — LIPID PANEL
CHOLESTEROL: 213 mg/dL — AB (ref 0–200)
HDL: 75.9 mg/dL (ref >39.00)
LDL CALC: 127 mg/dL — AB (ref 0–99)
NonHDL: 137.08
Total CHOL/HDL Ratio: 3
Triglycerides: 52 mg/dL (ref 0.0–149.0)
VLDL: 10.4 mg/dL (ref 0.0–40.0)

## 2015-10-03 LAB — MICROALBUMIN / CREATININE URINE RATIO
Creatinine,U: 290.1 mg/dL
MICROALB UR: 41.2 mg/dL — AB (ref 0.0–1.9)
Microalb Creat Ratio: 14.2 mg/g (ref 0.0–30.0)

## 2015-10-03 LAB — POCT URINALYSIS DIPSTICK
BILIRUBIN UA: NEGATIVE
Blood, UA: NEGATIVE
Glucose, UA: NEGATIVE
Leukocytes, UA: NEGATIVE
Nitrite, UA: NEGATIVE
Spec Grav, UA: >=1.03
Urobilinogen, UA: 0.2
pH, UA: 6

## 2015-10-03 LAB — HEMOGLOBIN A1C: Hgb A1c MFr Bld: 8.5 % — ABNORMAL HIGH (ref 4.6–6.5)

## 2015-10-03 LAB — PSA: PSA: 0.31 ng/mL (ref 0.10–4.00)

## 2015-10-03 NOTE — Progress Notes (Signed)
Patient ID: Kenneth Durham, male    DOB: [birth year]  Age: 49 y.o. MRN: [id number]    Subjective:  Subjective HPI DAIJON Durham presents for cpe.  He is also c/o R calf pain -- he was bowling Wed and heard/ felt a pop in R calf --- it hurts to walk since.  He also c/o low back pain-- no injury.   No radiation of pain. It feels like something in his back is shifting.    Review of Systems  Constitutional: Negative.   HENT: Negative for congestion, ear pain, hearing loss, nosebleeds, postnasal drip, rhinorrhea, sinus pressure, sneezing and tinnitus.   Eyes: Negative for photophobia, discharge, itching and visual disturbance.  Respiratory: Negative.   Cardiovascular: Negative.   Gastrointestinal: Negative for abdominal pain, constipation, blood in stool, abdominal distention and anal bleeding.  Endocrine: Negative.   Genitourinary: Negative.   Musculoskeletal: Positive for back pain and gait problem. Negative for myalgias, joint swelling, neck pain and neck stiffness.  Skin: Negative.  Negative for rash and wound.  Allergic/Immunologic: Negative.   Neurological: Negative for dizziness, weakness, light-headedness, numbness and headaches.  Psychiatric/Behavioral: Negative for suicidal ideas, confusion, sleep disturbance, dysphoric mood, decreased concentration and agitation. The patient is not nervous/anxious.     History Past Medical History  Diagnosis Date  . GOITER, NONTOXIC MULTINODULAR 09/22/2007  . DIABETES MELLITUS, TYPE II 09/13/2007  . FATTY LIVER DISEASE 10/29/2008  . DISEASE, CHRONIC NONALCOHOLIC LIVER NOS 78/01/9561    He has no past surgical history on file.   His family history includes Cancer in his other; Diabetes in his other; Heart disease in his other; Hypertension in his other.He reports that he has quit smoking. He does not have any smokeless tobacco history on file. He reports that he does not drink alcohol or use illicit drugs.  Current Outpatient  Prescriptions on File Prior to Visit  Medication Sig Dispense Refill  . glucose blood test strip Use as instructed bid 100 each 12  . insulin aspart (NOVOLOG FLEXPEN) 100 UNIT/ML FlexPen Inject 8 Units into the skin 3 (three) times daily with meals. And pen needles 4/day 15 mL 11  . Insulin Glargine (LANTUS SOLOSTAR) 100 UNIT/ML Solostar Pen Inject 30 Units into the skin daily. And pen needles 1/day 5 pen 3   No current facility-administered medications on file prior to visit.     Objective:  Objective Physical Exam  Constitutional: He is oriented to person, place, and time. He appears well-developed and well-nourished. No distress.  HENT:  Head: Normocephalic and atraumatic.  Right Ear: External ear normal.  Left Ear: External ear normal.  Nose: Nose normal.  Mouth/Throat: Oropharynx is clear and moist. No oropharyngeal exudate.  Eyes: Conjunctivae and EOM are normal. Pupils are equal, round, and reactive to light. Right eye exhibits no discharge. Left eye exhibits no discharge.  Neck: Normal range of motion. Neck supple. No JVD present. No thyromegaly present.  Cardiovascular: Normal rate, regular rhythm and intact distal pulses.  Exam reveals no gallop and no friction rub.   No murmur heard. Pulmonary/Chest: Effort normal and breath sounds normal. No respiratory distress. He has no wheezes. He has no rales. He exhibits no tenderness.  Abdominal: Soft. Bowel sounds are normal. He exhibits no distension and no mass. There is no tenderness. There is no rebound and no guarding.  Genitourinary: Rectum normal, prostate normal and penis normal. Guaiac negative stool.  Musculoskeletal: Normal range of motion. He exhibits no edema or tenderness.  Lymphadenopathy:  He has no cervical adenopathy.  Neurological: He is alert and oriented to person, place, and time. He displays normal reflexes. No cranial nerve deficit. He exhibits normal muscle tone. Coordination normal.  Skin: Skin is warm and  dry. No rash noted. He is not diaphoretic. No erythema. No pallor.  Psychiatric: He has a normal mood and affect. His behavior is normal. Judgment and thought content normal.  Nursing note and vitals reviewed. Sensory exam of the foot is normal, tested with the monofilament. Good pulses, no lesions or ulcers, good peripheral pulses.   BP 132/80 mmHg  Pulse 81  Temp(Src) 98.1 F (36.7 C) (Oral)  Ht 6' (1.829 m)  Wt 200 lb 6.4 oz (90.901 kg)  BMI 27.17 kg/m2  SpO2 98% Wt Readings from Last 3 Encounters:  10/03/15 200 lb 6.4 oz (90.901 kg)  09/11/15 203 lb (92.08 kg)  07/11/15 188 lb (85.276 kg)     Lab Results  Component Value Date   GLUCOSE 160* 06/27/2015   CHOL 213* 10/03/2015   TRIG 52.0 10/03/2015   HDL 75.90 10/03/2015   LDLDIRECT 153.5 01/15/2011   LDLCALC 127* 10/03/2015   ALT 43 06/27/2015   AST 38* 06/27/2015   NA 140 06/27/2015   K 4.2 06/27/2015   CL 104 06/27/2015   CREATININE 0.76 06/27/2015   BUN 15 06/27/2015   CO2 27 06/27/2015   TSH 1.51 06/17/2015   PSA 0.31 10/03/2015   HGBA1C 8.5* 10/03/2015   MICROALBUR 41.2* 10/03/2015    Dg Chest 2 View  06/18/2013  *RADIOLOGY REPORT* Clinical Data: Mass in the inferior aspect of the sternum. CHEST - 2 VIEW,LEFT RIBS - 2 VIEW,RIGHT RIBS - 2 VIEW Comparison: Chest x-ray 01/27/2009. Findings: Lung volumes are normal.  No consolidative airspace disease.  No pleural effusions.  No pneumothorax.  No pulmonary nodule or mass noted.  Pulmonary vasculature and the cardiomediastinal silhouette are within normal limits.  On the lateral projection the marker placed over the inferior aspect of the sternum is noted, but no definite underlying masses confidently identified on this plain film examination. Dedicated views of the left ribs demonstrate no acute displaced fractures, and no definite bony mass. Dedicated views of the right ribs demonstrate no definite displaced fracture and no definite bony mass. IMPRESSION: 1.  No soft  tissue or bony mass is identified to correspond to the palpable abnormality in the inferior aspect of the sternum. 2.  No radiographic evidence of acute cardiopulmonary disease. Original Report Authenticated By: Vinnie Langton, M.D.   Dg Ribs Unilateral Left  06/18/2013  *RADIOLOGY REPORT* Clinical Data: Mass in the inferior aspect of the sternum. CHEST - 2 VIEW,LEFT RIBS - 2 VIEW,RIGHT RIBS - 2 VIEW Comparison: Chest x-ray 01/27/2009. Findings: Lung volumes are normal.  No consolidative airspace disease.  No pleural effusions.  No pneumothorax.  No pulmonary nodule or mass noted.  Pulmonary vasculature and the cardiomediastinal silhouette are within normal limits.  On the lateral projection the marker placed over the inferior aspect of the sternum is noted, but no definite underlying masses confidently identified on this plain film examination. Dedicated views of the left ribs demonstrate no acute displaced fractures, and no definite bony mass. Dedicated views of the right ribs demonstrate no definite displaced fracture and no definite bony mass. IMPRESSION: 1.  No soft tissue or bony mass is identified to correspond to the palpable abnormality in the inferior aspect of the sternum. 2.  No radiographic evidence of acute cardiopulmonary disease. Original Report Authenticated  By: Vinnie Langton, M.D.   Dg Ribs Unilateral Right  06/18/2013  *RADIOLOGY REPORT* Clinical Data: Mass in the inferior aspect of the sternum. CHEST - 2 VIEW,LEFT RIBS - 2 VIEW,RIGHT RIBS - 2 VIEW Comparison: Chest x-ray 01/27/2009. Findings: Lung volumes are normal.  No consolidative airspace disease.  No pleural effusions.  No pneumothorax.  No pulmonary nodule or mass noted.  Pulmonary vasculature and the cardiomediastinal silhouette are within normal limits.  On the lateral projection the marker placed over the inferior aspect of the sternum is noted, but no definite underlying masses confidently identified on this plain film  examination. Dedicated views of the left ribs demonstrate no acute displaced fractures, and no definite bony mass. Dedicated views of the right ribs demonstrate no definite displaced fracture and no definite bony mass. IMPRESSION: 1.  No soft tissue or bony mass is identified to correspond to the palpable abnormality in the inferior aspect of the sternum. 2.  No radiographic evidence of acute cardiopulmonary disease. Original Report Authenticated By: Vinnie Langton, M.D.     Assessment & Plan:  Plan I am having Mr. Warmuth maintain his insulin aspart, glucose blood, and Insulin Glargine.  No orders of the defined types were placed in this encounter.    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  Garnet Koyanagi, DO

## 2015-10-03 NOTE — Progress Notes (Signed)
Pre visit review using our clinic review tool, if applicable. No additional management support is needed unless otherwise documented below in the visit note. 

## 2015-10-03 NOTE — Assessment & Plan Note (Signed)
Per endo Check labs today

## 2015-10-03 NOTE — Assessment & Plan Note (Signed)
See avs ghm utd  Check labs 

## 2015-10-03 NOTE — Patient Instructions (Signed)

## 2015-10-06 ENCOUNTER — Encounter: Payer: Self-pay | Admitting: Family Medicine

## 2015-10-06 ENCOUNTER — Ambulatory Visit (INDEPENDENT_AMBULATORY_CARE_PROVIDER_SITE_OTHER): Payer: BLUE CROSS/BLUE SHIELD | Admitting: Family Medicine

## 2015-10-06 VITALS — BP 135/98 | Ht 73.0 in | Wt 190.0 lb

## 2015-10-06 DIAGNOSIS — S86812A Strain of other muscle(s) and tendon(s) at lower leg level, left leg, initial encounter: Secondary | ICD-10-CM | POA: Diagnosis not present

## 2015-10-06 NOTE — Patient Instructions (Signed)
You have a calf strain (medial gastrocnemius muscle) Compression sleeve when up and walking around for next 4 weeks. Icing or heat for 15 minutes at a time 3-4 times a day (whichever feels better). Heel lifts either in temporary orthotics or on their own to prevent further strain. Ibuprofen 600mg  three times a day with food OR aleve 2 tabs twice a day with food for pain and inflammation. Calf raises 3 sets of 10 once a day - start with both legs, advance to single leg.  Don't do these on a step until I see you back. Activities as tolerated otherwise - generally speaking we prefer your pain level stay less than a 3 on a scale of 1-10 with activities to lessen the risk of worsening this or reinjury. Follow up with me in 4 weeks.

## 2015-10-06 NOTE — Progress Notes (Signed)
PCP and referred by: Garnet Koyanagi, DO  Subjective:   HPI: Patient is a 49 y.o. male here for left calf pain.  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 Pain level 5/10 currently - worse over 24 hours following the injury. Pain is sharp. No swelling or bruising. No prior injuries. No skin changes, fever, other complaints.  Past Medical History  Diagnosis Date  . GOITER, NONTOXIC MULTINODULAR 09/22/2007  . DIABETES MELLITUS, TYPE II 09/13/2007  . FATTY LIVER DISEASE 10/29/2008  . DISEASE, CHRONIC NONALCOHOLIC LIVER NOS 68/12/2749    Current Outpatient Prescriptions on File Prior to Visit  Medication Sig Dispense Refill  . glucose blood test strip Use as instructed bid 100 each 12  . insulin aspart (NOVOLOG FLEXPEN) 100 UNIT/ML FlexPen Inject 8 Units into the skin 3 (three) times daily with meals. And pen needles 4/day 15 mL 11  . Insulin Glargine (LANTUS SOLOSTAR) 100 UNIT/ML Solostar Pen Inject 30 Units into the skin daily. And pen needles 1/day 5 pen 3   No current facility-administered medications on file prior to visit.    No past surgical history on file.  Allergies  Allergen Reactions  . Shrimp [Shellfish Allergy] Hives and Swelling    Social History   Social History  . Marital Status: Married    Spouse Name: N/A  . Number of Children: N/A  . Years of Education: N/A   Occupational History  . 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 Smoking status: Former Research scientist (life sciences)  . Smokeless tobacco: Not on file  . Alcohol Use: No  . Drug Use: No  . Sexual Activity: Not on file   Other Topics Concern  . Not on file   Social History Narrative    Family History  Problem Relation Age of Onset  . Diabetes Other     1st degree relative  . Hypertension Other   . Heart disease Other     FH of Cardiac Murmur  . Cancer Other     Colon Cancer    BP  135/98 mmHg  Ht 6\' 1"  (1.854 m)  Wt 190 lb (86.183 kg)  BMI 25.07 kg/m2  Review of Systems: See HPI above.    Objective:  Physical Exam:  Gen: NAD  Left lower leg: No gross deformity, swelling, bruising, palpable cords. TTP medial gastroc.  No other lower leg tenderness. FROM ankle, knee.  Pain with single leg calf raise reproducing his pain. NVI distally. Negative thompsons.    Right lower leg: FROM knee and ankle without pain.  Assessment & Plan:  1. Left calf strain - Start with compression sleeve, heel lifts.  Shown home exercises to do daily as he improves.  NSAIDs, ice/heat as needed.  F/u in 4 weeks.  Activities as tolerated.

## 2015-10-07 DIAGNOSIS — S86819A Strain of other muscle(s) and tendon(s) at lower leg level, unspecified leg, initial encounter: Secondary | ICD-10-CM | POA: Insufficient documentation

## 2015-10-07 NOTE — Assessment & Plan Note (Signed)
Start with compression sleeve, heel lifts.  Shown home exercises to do daily as he improves.  NSAIDs, ice/heat as needed.  F/u in 4 weeks.  Activities as tolerated.

## 2015-10-10 ENCOUNTER — Telehealth: Payer: Self-pay

## 2015-10-10 ENCOUNTER — Other Ambulatory Visit: Payer: Self-pay | Admitting: Endocrinology

## 2015-10-10 DIAGNOSIS — R809 Proteinuria, unspecified: Secondary | ICD-10-CM

## 2015-10-10 MED ORDER — PRAVASTATIN SODIUM 20 MG PO TABS: 20.0000 mg | ORAL_TABLET | Freq: Every day | ORAL | Status: DC

## 2015-10-10 MED ORDER — INSULIN ASPART 100 UNIT/ML FLEXPEN: 10.0000 [IU] | PEN_INJECTOR | Freq: Three times a day (TID) | SUBCUTANEOUS | Status: DC

## 2015-10-10 NOTE — Telephone Encounter (Signed)
-----   Message from Rosalita Chessman, DO sent at 10/09/2015  8:57 PM EDT ----- hga1c still elevated----  But better Increase insulin to 10 u 3 x a day Cholesterol--- LDL goal < 70,  HDL >40,  TG < 150.  Diet and exercise will increase HDL and decrease LDL and TG.  Fish,  Fish Oil, Flaxseed oil will also help increase the HDL and decrease Triglycerides.  Start pravachol 20 mg #30  1 a day, 2 refills.    Recheck labs in 3 months Lipid, cmp, hgba1c microalbumin is elevated--- check 24 h urine for protein .

## 2015-10-10 NOTE — Telephone Encounter (Signed)
Spoke with the patient and verbalized understanding. He had agreed to start the Pravastatin, increase insulin, watch his diet and exercise. He will stop by the office to pick up the 24 hour urine container next week.     KP

## 2015-11-03 ENCOUNTER — Ambulatory Visit (INDEPENDENT_AMBULATORY_CARE_PROVIDER_SITE_OTHER): Payer: BLUE CROSS/BLUE SHIELD | Admitting: Family Medicine

## 2015-11-03 ENCOUNTER — Encounter: Payer: Self-pay | Admitting: Family Medicine

## 2015-11-03 VITALS — BP 129/81 | HR 89 | Ht 72.0 in | Wt 185.0 lb

## 2015-11-03 DIAGNOSIS — S86812D Strain of other muscle(s) and tendon(s) at lower leg level, left leg, subsequent encounter: Secondary | ICD-10-CM | POA: Diagnosis not present

## 2015-11-03 NOTE — Patient Instructions (Signed)
Continue the home exercises 3-4 times a week for another 6 weeks (calf stretching, calf raise exercise). No restrictions on your activities. Try to still avoid flat shoes if possible or wear a heel lift in flat shoes if you do wear these. Follow up with me as needed otherwise.

## 2015-11-04 NOTE — Progress Notes (Signed)
PCP and referred by: Garnet Koyanagi, DO  Subjective:   HPI: Patient is a 49 y.o. male here for left calf pain.  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 Pain level 5/10 currently - worse over 24 hours following the injury. Pain is sharp. No swelling or bruising. No prior injuries. No skin changes, fever, other complaints.  11/14: Patient reports his pain is down to 0/10 level. Mowed lawn a few days after last visit and had a lot of pain. Had a painless pop in right calf a couple days ago - no issues. He is doing home exercises, has since stopped using sleeve, heel lifts. No problems with bowling. No skin changes, fever, other complaints.  Past Medical History  Diagnosis Date  . GOITER, NONTOXIC MULTINODULAR 09/22/2007  . DIABETES MELLITUS, TYPE II 09/13/2007  . FATTY LIVER DISEASE 10/29/2008  . DISEASE, CHRONIC NONALCOHOLIC LIVER NOS 0000000    Current Outpatient Prescriptions on File Prior to Visit  Medication Sig Dispense Refill  . glucose blood test strip Use as instructed bid 100 each 12  . insulin aspart (NOVOLOG FLEXPEN) 100 UNIT/ML FlexPen Inject 10 Units into the skin 3 (three) times daily with meals. And pen needles 4/day 15 mL 11  . Insulin Glargine (LANTUS SOLOSTAR) 100 UNIT/ML Solostar Pen Inject 30 Units into the skin daily. And pen needles 1/day 5 pen 3  . NOVOLOG FLEXPEN 100 UNIT/ML FlexPen INJECT 8 UNITS INTO THE SKIN 3 (THREE) TIMES DAILY WITH MEALS. AND PEN NEEDLES 4/DAY 15 mL 2  . pravastatin (PRAVACHOL) 20 MG tablet Take 1 tablet (20 mg total) by mouth daily. 30 tablet 2   No current facility-administered medications on file prior to visit.    No past surgical history on file.  Allergies  Allergen Reactions  . Shrimp [Shellfish Allergy] Hives and Swelling    Social History   Social History  . Marital Status: Married    Spouse Name: N/A  . Number of  Children: N/A  . Years of Education: N/A   Occupational History  . 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 Smoking status: Former Research scientist (life sciences)  . Smokeless tobacco: Not on file  . Alcohol Use: No  . Drug Use: No  . Sexual Activity: Not on file   Other Topics Concern  . Not on file   Social History Narrative    Family History  Problem Relation Age of Onset  . Diabetes Other     1st degree relative  . Hypertension Other   . Heart disease Other     FH of Cardiac Murmur  . Cancer Other     Colon Cancer    BP 129/81 mmHg  Pulse 89  Ht 6' (1.829 m)  Wt 185 lb (83.915 kg)  BMI 25.08 kg/m2  Review of Systems: See HPI above.    Objective:  Physical Exam:  Gen: NAD  Left lower leg: No gross deformity, swelling, bruising, palpable cords. No TTP medial gastroc.  No other lower leg tenderness. FROM ankle, knee.  No pain with single calf raise. NVI distally. Negative thompsons.    Right lower leg: FROM knee and ankle without pain.  Assessment & Plan:  1. Left calf strain - Much improved at this point.  Continue with home exercises for 6 more weeks.  Advised to be careful about flat  shoes and barefoot walking.  F/u prn.

## 2015-11-04 NOTE — Assessment & Plan Note (Signed)
Much improved at this point.  Continue with home exercises for 6 more weeks.  Advised to be careful about flat shoes and barefoot walking.  F/u prn.

## 2015-11-06 ENCOUNTER — Other Ambulatory Visit: Payer: Self-pay

## 2015-11-06 ENCOUNTER — Ambulatory Visit (INDEPENDENT_AMBULATORY_CARE_PROVIDER_SITE_OTHER): Payer: BLUE CROSS/BLUE SHIELD | Admitting: Endocrinology

## 2015-11-06 ENCOUNTER — Encounter: Payer: Self-pay | Admitting: Endocrinology

## 2015-11-06 VITALS — BP 114/86 | HR 84 | Temp 98.5°F | Ht 73.0 in | Wt 203.0 lb

## 2015-11-06 DIAGNOSIS — Z794 Long term (current) use of insulin: Secondary | ICD-10-CM | POA: Diagnosis not present

## 2015-11-06 DIAGNOSIS — E08319 Diabetes mellitus due to underlying condition with unspecified diabetic retinopathy without macular edema: Secondary | ICD-10-CM

## 2015-11-06 MED ORDER — INSULIN GLARGINE 100 UNIT/ML SOLOSTAR PEN: 32.0000 [IU] | PEN_INJECTOR | Freq: Every day | SUBCUTANEOUS | Status: DC

## 2015-11-06 NOTE — Progress Notes (Signed)
Subjective:    Patient ID: Kenneth Durham, male    DOB: [birth year], 49 y.o.   MRN: [id number]  HPI Pt returns for f/u of diabetes mellitus: DM type: 1   Dx'ed: [id number] Complications: none.  Therapy: insulin since mid-2016.  DKA: once, in early 2016 Severe hypoglycemia: never.  Pancreatitis: never.  Other: he takes multiple daily injections; he works 3rd shift.   Interval history: He has mild hypoglycemia approx 2-3 times per week.  This usually happens when he takes novolog, but does not eat.  Multinodular goiter was noted in 2008.  He was noted in early 2014 for the first time, to have a suppressed TSH.  i-131 was considered, but TSH was only slightly abnormal, so we decided to hold off.   Past Medical History  Diagnosis Date  . GOITER, NONTOXIC MULTINODULAR 09/22/2007  . DIABETES MELLITUS, TYPE II 09/13/2007  . FATTY LIVER DISEASE 10/29/2008  . DISEASE, CHRONIC NONALCOHOLIC LIVER NOS 0000000    No past surgical history on file.  Social History   Social History  . Marital Status: Married    Spouse Name: N/A  . Number of Children: N/A  . Years of Education: N/A   Occupational History  . 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 Smoking status: Former Research scientist (life sciences)  . Smokeless tobacco: Not on file  . Alcohol Use: No  . Drug Use: No  . Sexual Activity: Not on file   Other Topics Concern  . Not on file   Social History Narrative    Current Outpatient Prescriptions on File Prior to Visit  Medication Sig Dispense Refill  . B-D ULTRAFINE III SHORT PEN 31G X 8 MM MISC     . glucose blood test strip Use as instructed bid 100 each 12  . insulin aspart (NOVOLOG FLEXPEN) 100 UNIT/ML FlexPen Inject 10 Units into the skin 3 (three) times daily with meals. And pen needles 4/day 15 mL 11  . pravastatin (PRAVACHOL) 20 MG tablet Take 1 tablet (20 mg total) by mouth daily. (Patient not taking: Reported on 11/06/2015) 30 tablet 2   No current  facility-administered medications on file prior to visit.    Allergies  Allergen Reactions  . Shrimp [Shellfish Allergy] Hives and Swelling    Family History  Problem Relation Age of Onset  . Diabetes Other     1st degree relative  . Hypertension Other   . Heart disease Other     FH of Cardiac Murmur  . Cancer Other     Colon Cancer    BP 114/86 mmHg  Pulse 84  Temp(Src) 98.5 F (36.9 C) (Oral)  Ht 6\' 1"  (1.854 m)  Wt 203 lb (92.08 kg)  BMI 26.79 kg/m2  SpO2 98%   Review of Systems Denies LOC.     Objective:   Physical Exam VITAL SIGNS:  See vs page GENERAL: no distress SKIN:  Insulin injection sites at the anterior abdomen are normal.       Assessment & Plan:  DM: therapy limited by noncompliance with cbg monitoring, and eating after novolog.  i'll do the best i can.   Patient is advised the following: Patient Instructions  check your blood sugar 4 times a day: before the 3 meals, and at bedtime.  also check if you have symptoms of your blood sugar being too high or too low.  please keep a record of the readings and bring it  to your next appointment here.  You can write it on any piece of paper.  please call us sooner if your blood sugar goes below 70, or if you have a lot of readings over 200.  Please increase the lantus to 32 units at daily, and:  Please continue the same novolog, but please take this no matter what your blood sugar is, but only when you eat.  If you are unable to anticipate the exercise, eat a light snack with it.   Please come back for a follow-up appointment in 3 months.

## 2015-11-06 NOTE — Patient Instructions (Addendum)
check your blood sugar 4 times a day: before the 3 meals, and at bedtime.  also check if you have symptoms of your blood sugar being too high or too low.  please keep a record of the readings and bring it to your next appointment here.  You can write it on any piece of paper.  please call us sooner if your blood sugar goes below 70, or if you have a lot of readings over 200.  Please increase the lantus to 32 units at daily, and:  Please continue the same novolog, but please take this no matter what your blood sugar is, but only when you eat.  If you are unable to anticipate the exercise, eat a light snack with it.   Please come back for a follow-up appointment in 3 months.

## 2015-12-08 ENCOUNTER — Encounter: Payer: BLUE CROSS/BLUE SHIELD | Admitting: Gastroenterology

## 2015-12-19 ENCOUNTER — Other Ambulatory Visit: Payer: Self-pay | Admitting: *Deleted

## 2015-12-19 MED ORDER — PRAVASTATIN SODIUM 20 MG PO TABS: 20.0000 mg | ORAL_TABLET | Freq: Every day | ORAL | Status: DC

## 2015-12-19 NOTE — Telephone Encounter (Signed)
Pharmacy requesting for 90 day supply.   Rx sent to the pharmacy by e-script.//AB/CMA

## 2015-12-24 ENCOUNTER — Ambulatory Visit (AMBULATORY_SURGERY_CENTER): Payer: Self-pay | Admitting: *Deleted

## 2015-12-24 ENCOUNTER — Encounter: Payer: Self-pay | Admitting: *Deleted

## 2015-12-24 VITALS — Ht 72.0 in | Wt 211.9 lb

## 2015-12-24 DIAGNOSIS — Z1211 Encounter for screening for malignant neoplasm of colon: Secondary | ICD-10-CM

## 2015-12-24 MED ORDER — NA SULFATE-K SULFATE-MG SULF 17.5-3.13-1.6 GM/177ML PO SOLN: 1.0000 | Freq: Once | ORAL | Status: DC

## 2015-12-24 NOTE — Progress Notes (Signed)
No egg or soy allergy known to patient  No issues with past sedation with any surgeries  or procedures, no intubation problems  No diet pills No home 02 use per patient   emmi video     [email or web address]   Pt wanted to have a Monday procedure so RS to 2-20 Monday as per pt request

## 2016-01-06 ENCOUNTER — Encounter: Payer: BLUE CROSS/BLUE SHIELD | Admitting: Gastroenterology

## 2016-02-02 ENCOUNTER — Other Ambulatory Visit: Payer: Self-pay

## 2016-02-02 MED ORDER — BD PEN NEEDLE SHORT U/F 31G X 8 MM MISC: Status: DC

## 2016-02-09 ENCOUNTER — Encounter: Payer: BLUE CROSS/BLUE SHIELD | Admitting: Gastroenterology

## 2016-02-09 ENCOUNTER — Telehealth: Payer: Self-pay | Admitting: Gastroenterology

## 2016-02-09 ENCOUNTER — Ambulatory Visit: Payer: BLUE CROSS/BLUE SHIELD | Admitting: Endocrinology

## 2016-02-09 NOTE — Telephone Encounter (Signed)
Ok, no charge  

## 2016-02-09 NOTE — Telephone Encounter (Signed)
Per pt he stopped eating earlier in the week, because he thought he was supposed to. He ended up eating last night, because he felt terrible. He would like to cancel his procedure for this afternoon, and r/s once he see's his PCP. Would you like to charge late cancellation fee?

## 2016-02-16 ENCOUNTER — Ambulatory Visit (INDEPENDENT_AMBULATORY_CARE_PROVIDER_SITE_OTHER): Payer: BLUE CROSS/BLUE SHIELD | Admitting: Endocrinology

## 2016-02-16 ENCOUNTER — Encounter: Payer: Self-pay | Admitting: Endocrinology

## 2016-02-16 VITALS — BP 114/78 | HR 90 | Temp 98.4°F | Ht 72.0 in | Wt 213.0 lb

## 2016-02-16 DIAGNOSIS — Z794 Long term (current) use of insulin: Secondary | ICD-10-CM

## 2016-02-16 DIAGNOSIS — E109 Type 1 diabetes mellitus without complications: Secondary | ICD-10-CM | POA: Diagnosis not present

## 2016-02-16 DIAGNOSIS — E08319 Diabetes mellitus due to underlying condition with unspecified diabetic retinopathy without macular edema: Secondary | ICD-10-CM | POA: Diagnosis not present

## 2016-02-16 LAB — POCT GLYCOSYLATED HEMOGLOBIN (HGB A1C): Hemoglobin A1C: 7.8

## 2016-02-16 NOTE — Progress Notes (Signed)
Subjective:    Patient ID: Kenneth Durham, male    DOB: [birth year], 50 y.o.   MRN: [id number]  HPI Pt returns for f/u of diabetes mellitus: DM type: 1   Dx'ed: [id number] Complications: none.  Therapy: insulin since mid-2016.  DKA: once, in early 2016 Severe hypoglycemia: never.  Pancreatitis: never.  Other: he takes multiple daily injections; he works 3rd shift.   Interval history: Pt says he has mild hypoglycemia when he takes the novolog, but does not eat.  pt states he feels well in general.  Multinodular goiter was noted in 2008.  He was noted in early 2014 for the first time, to have a suppressed TSH.  I-131 was considered, but TSH was only slightly abnormal, so we decided to hold off.   Past Medical History  Diagnosis Date  . GOITER, NONTOXIC MULTINODULAR 09/22/2007  . DIABETES MELLITUS, TYPE II 09/13/2007  . FATTY LIVER DISEASE 10/29/2008  . DISEASE, CHRONIC NONALCOHOLIC LIVER NOS 0000000  . Cataract     starting  . Hyperlipidemia     Past Surgical History  Procedure Laterality Date  . Wisdom tooth extraction      Social History   Social History  . Marital Status: Married    Spouse Name: N/A  . Number of Children: N/A  . Years of Education: N/A   Occupational History  . 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 Smoking status: Former Research scientist (life sciences)  . Smokeless tobacco: Never Used  . Alcohol Use: 0.0 oz/week    0 Standard drinks or equivalent per week     Comment: rare   . Drug Use: No  . Sexual Activity: Not on file   Other Topics Concern  . Not on file   Social History Narrative    Current Outpatient Prescriptions on File Prior to Visit  Medication Sig Dispense Refill  . aspirin 81 MG chewable tablet Chew 81 mg by mouth daily.    Marland Kitchen glucose blood test strip Use as instructed bid 100 each 12  . insulin aspart (NOVOLOG FLEXPEN) 100 UNIT/ML FlexPen Inject 10 Units into the skin 3 (three) times daily with meals. And pen  needles 4/day 15 mL 11  . Insulin Glargine (LANTUS SOLOSTAR) 100 UNIT/ML Solostar Pen Inject 32 Units into the skin daily. And pen needles 1/day 30 mL 1  . pravastatin (PRAVACHOL) 20 MG tablet Take 1 tablet (20 mg total) by mouth daily. 90 tablet 0   No current facility-administered medications on file prior to visit.    Allergies  Allergen Reactions  . Shrimp [Shellfish Allergy] Hives and Swelling    Family History  Problem Relation Age of Onset  . Diabetes Other     1st degree relative  . Hypertension Other   . Heart disease Other     FH of Cardiac Murmur  . Cancer Other     Colon Cancer- cousin   . Esophageal cancer Maternal Uncle   . Rectal cancer Neg Hx   . Stomach cancer Neg Hx   . Colon polyps Neg Hx     BP 114/78 mmHg  Pulse 90  Temp(Src) 98.4 F (36.9 C) (Oral)  Ht 6' (1.829 m)  Wt 213 lb (96.616 kg)  BMI 28.88 kg/m2   Review of Systems Denies LOC.      Objective:   Physical Exam VITAL SIGNS:  See vs page GENERAL: no distress Pulses: dorsalis pedis intact bilat.  MSK: no deformity of the feet CV: no leg edema Skin:  no ulcer on the feet.  normal color and temp on the feet. Neuro: sensation is intact to touch on the feet.   Ext: There is bilateral onychomycosis of the toenails   Lab Results  Component Value Date   HGBA1C 7.8 02/16/2016      Assessment & Plan:  DM: glycemic control is better, but the next step is to avoid hypoglycemia.   Patient is advised the following: Patient Instructions  check your blood sugar 4 times a day: before the 3 meals, and at bedtime.  also check if you have symptoms of your blood sugar being too high or too low.  please keep a record of the readings and bring it to your next appointment here.  You can write it on any piece of paper.  please call us sooner if your blood sugar goes below 70, or if you have a lot of readings over 200.  Please continue the same lantus.   Please continue the same novolog, but please  take this no matter what your blood sugar is, but only when you eat.  If you are unable to anticipate the exercise, eat a light snack with it.   Please come back for a follow-up appointment in 3 months.

## 2016-02-16 NOTE — Patient Instructions (Addendum)
check your blood sugar 4 times a day: before the 3 meals, and at bedtime.  also check if you have symptoms of your blood sugar being too high or too low.  please keep a record of the readings and bring it to your next appointment here.  You can write it on any piece of paper.  please call us sooner if your blood sugar goes below 70, or if you have a lot of readings over 200.  Please continue the same lantus.   Please continue the same novolog, but please take this no matter what your blood sugar is, but only when you eat.  If you are unable to anticipate the exercise, eat a light snack with it.   Please come back for a follow-up appointment in 3 months.

## 2016-02-17 DIAGNOSIS — E119 Type 2 diabetes mellitus without complications: Secondary | ICD-10-CM | POA: Insufficient documentation

## 2016-02-20 ENCOUNTER — Other Ambulatory Visit: Payer: Self-pay

## 2016-02-20 MED ORDER — BASAGLAR KWIKPEN 100 UNIT/ML ~~LOC~~ SOPN: 32.0000 [IU] | PEN_INJECTOR | Freq: Every day | SUBCUTANEOUS | Status: DC

## 2016-02-23 ENCOUNTER — Other Ambulatory Visit: Payer: Self-pay

## 2016-02-23 MED ORDER — BASAGLAR KWIKPEN 100 UNIT/ML ~~LOC~~ SOPN: 32.0000 [IU] | PEN_INJECTOR | Freq: Every day | SUBCUTANEOUS | Status: DC

## 2016-02-24 ENCOUNTER — Encounter: Payer: Self-pay | Admitting: Family Medicine

## 2016-02-24 ENCOUNTER — Ambulatory Visit (INDEPENDENT_AMBULATORY_CARE_PROVIDER_SITE_OTHER): Payer: BLUE CROSS/BLUE SHIELD | Admitting: Family Medicine

## 2016-02-24 VITALS — BP 130/91 | HR 80 | Ht 72.0 in | Wt 215.0 lb

## 2016-02-24 DIAGNOSIS — M25562 Pain in left knee: Secondary | ICD-10-CM | POA: Diagnosis not present

## 2016-02-24 DIAGNOSIS — M25561 Pain in right knee: Secondary | ICD-10-CM | POA: Diagnosis not present

## 2016-02-24 MED ORDER — METHYLPREDNISOLONE ACETATE 40 MG/ML IJ SUSP
40.0000 mg | Freq: Once | INTRAMUSCULAR | Status: AC
  Administered 2016-02-24: 40 mg via INTRA_ARTICULAR

## 2016-02-24 NOTE — Patient Instructions (Signed)
Your knee pain is due to arthritis. These are the 4 classes of medicine you can take for this: Tylenol 500mg  1-2 tabs three times a day for pain. Aleve 1-2 tabs twice a day with food Glucosamine sulfate 750mg  twice a day is a supplement that may help. Capsaicin, aspercreme, or biofreeze topically up to four times a day may also help with pain. Cortisone injections are an option - you were given these today. If cortisone injections do not help, there are different types of shots that may help but they take longer to take effect. It's important that you continue to stay active. Straight leg raises, knee extensions 3 sets of 10 once a day (add ankle weight if these become too easy). Consider physical therapy to strengthen muscles around the joint that hurts to take pressure off of the joint itself. Shoe inserts with good arch support may be helpful. Heat or ice 15 minutes at a time 3-4 times a day as needed to help with pain. Water aerobics and cycling with low resistance are the best two types of exercise for arthritis. Follow up with me in 1 month or as needed.

## 2016-02-25 ENCOUNTER — Telehealth: Payer: Self-pay | Admitting: Family Medicine

## 2016-02-25 NOTE — Telephone Encounter (Signed)
Patient scheduled flu shot for 03/09/16 at 9:15am with the nurse.

## 2016-02-26 DIAGNOSIS — M25561 Pain in right knee: Secondary | ICD-10-CM | POA: Insufficient documentation

## 2016-02-26 DIAGNOSIS — M25562 Pain in left knee: Principal | ICD-10-CM

## 2016-02-26 NOTE — Progress Notes (Signed)
PCP: Garnet Koyanagi, DO  Subjective:   HPI: Patient is a 50 y.o. male here for bilateral knee pain.  Patient reports both his knees are swollen and stiff the past 3 weeks. No known injury or trauma. Up and down a lot more at his job at [employer] in this time. Pain level 5/10 on right, 10/10 left and sharp. Pain anterior but radiates to sides and posterior especially in left knee. Right more achy than left. Tried advil, tylenol, brace. No skin changes, fever, other complaints.  Past Medical History  Diagnosis Date  . GOITER, NONTOXIC MULTINODULAR 09/22/2007  . DIABETES MELLITUS, TYPE II 09/13/2007  . FATTY LIVER DISEASE 10/29/2008  . DISEASE, CHRONIC NONALCOHOLIC LIVER NOS 0000000  . Cataract     starting  . Hyperlipidemia     Current Outpatient Prescriptions on File Prior to Visit  Medication Sig Dispense Refill  . aspirin 81 MG chewable tablet Chew 81 mg by mouth daily.    Marland Kitchen glucose blood test strip Use as instructed bid 100 each 12  . insulin aspart (NOVOLOG FLEXPEN) 100 UNIT/ML FlexPen Inject 10 Units into the skin 3 (three) times daily with meals. And pen needles 4/day 15 mL 11  . Insulin Glargine (BASAGLAR KWIKPEN) 100 UNIT/ML SOPN Inject 0.32 mLs (32 Units total) into the skin at bedtime. 30 mL 2  . Insulin Pen Needle 31G X 5 MM MISC by Does not apply route 4 (four) times daily.    . pravastatin (PRAVACHOL) 20 MG tablet Take 1 tablet (20 mg total) by mouth daily. 90 tablet 0   No current facility-administered medications on file prior to visit.    Past Surgical History  Procedure Laterality Date  . Wisdom tooth extraction      Allergies  Allergen Reactions  . Shrimp [Shellfish Allergy] Hives and Swelling    Social History   Social History  . Marital Status: Married    Spouse Name: N/A  . Number of Children: N/A  . Years of Education: N/A   Occupational History  . 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 Smoking status:  Former Research scientist (life sciences)  . Smokeless tobacco: Never Used  . Alcohol Use: 0.0 oz/week    0 Standard drinks or equivalent per week     Comment: rare   . Drug Use: No  . Sexual Activity: Not on file   Other Topics Concern  . Not on file   Social History Narrative    Family History  Problem Relation Age of Onset  . Diabetes Other     1st degree relative  . Hypertension Other   . Heart disease Other     FH of Cardiac Murmur  . Cancer Other     Colon Cancer- cousin   . Esophageal cancer Maternal Uncle   . Rectal cancer Neg Hx   . Stomach cancer Neg Hx   . Colon polyps Neg Hx     BP 130/91 mmHg  Pulse 80  Ht 6' (1.829 m)  Wt 215 lb (97.523 kg)  BMI 29.15 kg/m2  Review of Systems: See HPI above.    Objective:  Physical Exam:  Gen: NAD, comfortable in exam room  Right knee: No gross deformity, ecchymoses, swelling. TTP medial, lateral joint lines, post patellar facets. FROM. Negative ant/post drawers. Negative valgus/varus testing. Negative lachmanns. Negative mcmurrays, apleys, patellar apprehension. NV intact distally.  Left knee: Mod effusion.  No other gross deformity, ecchymoses. TTP  medial, lateral joint lines, post patellar facets. FROM. Negative ant/post drawers. Negative valgus/varus testing. Negative lachmanns. Negative mcmurrays, apleys, patellar apprehension. NV intact distally.    Assessment & Plan:  1. Bilateral knee pain - 2/2 arthritis.  Discussed tylenol, nsaids, glucosamine, topical medications.  Injections given today along with aspiration of left knee.  Shown home exercises to do daily.  Ice/heat.  F/u in 1 month or prn.  After informed written consent, patient was lying supine on exam table. Right knee was prepped with alcohol swab and utilizing superolateral approach with ultrasound guidance, patient's right knee was injected intraarticularly with 3:1 marcaine: depomedrol. Patient tolerated the procedure well without immediate complications.  After  informed written consent patient was lying supine on exam table.  Left knee was prepped with alcohol swab.  Utilizing superolateral approach, 3 mL of marcaine was used for local anesthesia.  Then using an 18g needle on 60cc syringe, 43 mL of clear straw-colored fluid was aspirated from left knee.  Knee was then injected with 3:1 marcaine:depomedrol.  Patient tolerated procedure well without immediate complications

## 2016-02-26 NOTE — Assessment & Plan Note (Signed)
2/2 arthritis.  Discussed tylenol, nsaids, glucosamine, topical medications.  Injections given today along with aspiration of left knee.  Shown home exercises to do daily.  Ice/heat.  F/u in 1 month or prn.  After informed written consent, patient was lying supine on exam table. Right knee was prepped with alcohol swab and utilizing superolateral approach with ultrasound guidance, patient's right knee was injected intraarticularly with 3:1 marcaine: depomedrol. Patient tolerated the procedure well without immediate complications.  After informed written consent patient was lying supine on exam table.  Left knee was prepped with alcohol swab.  Utilizing superolateral approach, 3 mL of marcaine was used for local anesthesia.  Then using an 18g needle on 60cc syringe, 43 mL of clear straw-colored fluid was aspirated from left knee.  Knee was then injected with 3:1 marcaine:depomedrol.  Patient tolerated procedure well without immediate complications

## 2016-03-09 ENCOUNTER — Ambulatory Visit (INDEPENDENT_AMBULATORY_CARE_PROVIDER_SITE_OTHER): Payer: BLUE CROSS/BLUE SHIELD

## 2016-03-09 DIAGNOSIS — Z23 Encounter for immunization: Secondary | ICD-10-CM

## 2016-03-20 ENCOUNTER — Other Ambulatory Visit: Payer: Self-pay | Admitting: Family Medicine

## 2016-03-22 ENCOUNTER — Encounter: Payer: Self-pay | Admitting: Family Medicine

## 2016-03-22 ENCOUNTER — Ambulatory Visit (INDEPENDENT_AMBULATORY_CARE_PROVIDER_SITE_OTHER): Payer: BLUE CROSS/BLUE SHIELD | Admitting: Family Medicine

## 2016-03-22 VITALS — BP 144/85 | HR 105 | Ht 73.0 in | Wt 200.0 lb

## 2016-03-22 DIAGNOSIS — M25562 Pain in left knee: Secondary | ICD-10-CM | POA: Diagnosis not present

## 2016-03-22 DIAGNOSIS — S83207A Unspecified tear of unspecified meniscus, current injury, left knee, initial encounter: Secondary | ICD-10-CM | POA: Diagnosis not present

## 2016-03-22 DIAGNOSIS — M25561 Pain in right knee: Secondary | ICD-10-CM | POA: Diagnosis not present

## 2016-03-22 NOTE — Patient Instructions (Signed)
We will go ahead with an MRI to assess for a meniscus tear in your knee.

## 2016-03-23 NOTE — Assessment & Plan Note (Signed)
2/2 arthritis primarily though concern for medial meniscus tear of left knee as did not improve with aspiration/injection and pain more medial now.  Discussed tylenol, nsaids, glucosamine, topical medications.  We will go ahead with MRI of this knee.

## 2016-03-23 NOTE — Progress Notes (Signed)
PCP: Ann Held, DO  Subjective:   HPI: Patient is a 50 y.o. male here for bilateral knee pain.  3/7: Patient reports both his knees are swollen and stiff the past 3 weeks. No known injury or trauma. Up and down a lot more at his job at [employer] in this time. Pain level 5/10 on right, 10/10 left and sharp. Pain anterior but radiates to sides and posterior especially in left knee. Right more achy than left. Tried advil, tylenol, brace. No skin changes, fever, other complaints.  4/3: Patient reports right knee pain is down to 1/10. However, feels like left knee injection did not do anything - pain 10/10. Very slow in moving.  Pain is sharp, medial. Worse especially with stairs. No skin changes, fever.  Past Medical History  Diagnosis Date  . GOITER, NONTOXIC MULTINODULAR 09/22/2007  . DIABETES MELLITUS, TYPE II 09/13/2007  . FATTY LIVER DISEASE 10/29/2008  . DISEASE, CHRONIC NONALCOHOLIC LIVER NOS 0000000  . Cataract     starting  . Hyperlipidemia     Current Outpatient Prescriptions on File Prior to Visit  Medication Sig Dispense Refill  . aspirin 81 MG chewable tablet Chew 81 mg by mouth daily.    Marland Kitchen glucose blood test strip Use as instructed bid 100 each 12  . insulin aspart (NOVOLOG FLEXPEN) 100 UNIT/ML FlexPen Inject 10 Units into the skin 3 (three) times daily with meals. And pen needles 4/day 15 mL 11  . Insulin Glargine (BASAGLAR KWIKPEN) 100 UNIT/ML SOPN Inject 0.32 mLs (32 Units total) into the skin at bedtime. 30 mL 2  . Insulin Pen Needle 31G X 5 MM MISC by Does not apply route 4 (four) times daily.    . pravastatin (PRAVACHOL) 20 MG tablet TAKE 1 TABLET (20 MG TOTAL) BY MOUTH DAILY. 90 tablet 0   No current facility-administered medications on file prior to visit.    Past Surgical History  Procedure Laterality Date  . Wisdom tooth extraction      Allergies  Allergen Reactions  . Shrimp [Shellfish Allergy] Hives and Swelling    Social History    Social History  . Marital Status: Married    Spouse Name: N/A  . Number of Children: N/A  . Years of Education: N/A   Occupational History  . 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 Smoking status: Former Research scientist (life sciences)  . Smokeless tobacco: Never Used  . Alcohol Use: 0.0 oz/week    0 Standard drinks or equivalent per week     Comment: rare   . Drug Use: No  . Sexual Activity: Not on file   Other Topics Concern  . Not on file   Social History Narrative    Family History  Problem Relation Age of Onset  . Diabetes Other     1st degree relative  . Hypertension Other   . Heart disease Other     FH of Cardiac Murmur  . Cancer Other     Colon Cancer- cousin   . Esophageal cancer Maternal Uncle   . Rectal cancer Neg Hx   . Stomach cancer Neg Hx   . Colon polyps Neg Hx     BP 144/85 mmHg  Pulse 105  Ht 6\' 1"  (1.854 m)  Wt 200 lb (90.719 kg)  BMI 26.39 kg/m2  Review of Systems: See HPI above.    Objective:  Physical Exam:  Gen: NAD, comfortable in exam room  Right knee: No gross deformity, ecchymoses, swelling. No TTP currently. FROM. Negative ant/post drawers. Negative valgus/varus testing. Negative lachmanns. Negative mcmurrays, apleys, patellar apprehension. NV intact distally.  Left knee: Mod effusion.  No other gross deformity, ecchymoses. TTP medial joint line, post patellar facets. FROM. Negative ant/post drawers. Negative valgus/varus testing. Negative lachmanns. Negative mcmurrays, apleys, patellar apprehension. NV intact distally.    Assessment & Plan:  1. Bilateral knee pain - 2/2 arthritis primarily though concern for medial meniscus tear of left knee as did not improve with aspiration/injection and pain more medial now.  Discussed tylenol, nsaids, glucosamine, topical medications.  We will go ahead with MRI of this knee.

## 2016-03-29 ENCOUNTER — Encounter: Payer: Self-pay | Admitting: Family Medicine

## 2016-03-29 ENCOUNTER — Ambulatory Visit (INDEPENDENT_AMBULATORY_CARE_PROVIDER_SITE_OTHER): Payer: BLUE CROSS/BLUE SHIELD | Admitting: Family Medicine

## 2016-03-29 VITALS — BP 100/69 | HR 111 | Ht 73.0 in | Wt 200.0 lb

## 2016-03-29 DIAGNOSIS — M25561 Pain in right knee: Secondary | ICD-10-CM

## 2016-03-29 DIAGNOSIS — M25562 Pain in left knee: Secondary | ICD-10-CM

## 2016-03-29 MED ORDER — METHYLPREDNISOLONE ACETATE 40 MG/ML IJ SUSP
40.0000 mg | Freq: Once | INTRAMUSCULAR | Status: AC
  Administered 2016-03-29: 40 mg via INTRA_ARTICULAR

## 2016-03-29 NOTE — Patient Instructions (Signed)
Call me if the injection doesn't work this time. Next step would be to go ahead with orthopedic referral to discuss arthroscopy. These are the instructions we discussed previously: There are 4 classes of medicine you can take for this: Tylenol 500mg  1-2 tabs three times a day for pain. Aleve 1-2 tabs twice a day with food Glucosamine sulfate 750mg  twice a day is a supplement that may help. Capsaicin, aspercreme, or biofreeze topically up to four times a day may also help with pain. Cortisone injections are an option - you were given these today. If cortisone injections do not help, there are different types of shots that may help but they take longer to take effect (these would not help the meniscus tear, loose body). It's important that you continue to stay active. Straight leg raises, knee extensions 3 sets of 10 once a day (add ankle weight if these become too easy). Consider physical therapy to strengthen muscles around the joint that hurts to take pressure off of the joint itself. Shoe inserts with good arch support may be helpful. Heat or ice 15 minutes at a time 3-4 times a day as needed to help with pain. Water aerobics and cycling with low resistance are the best two types of exercise for arthritis. Avoid deep squats, deep lunges, leg press.

## 2016-03-30 NOTE — Progress Notes (Signed)
PCP: Ann Held, DO  Subjective:   HPI: Patient is a 50 y.o. male here for bilateral knee pain.  3/7: Patient reports both his knees are swollen and stiff the past 3 weeks. No known injury or trauma. Up and down a lot more at his job at [employer] in this time. Pain level 5/10 on right, 10/10 left and sharp. Pain anterior but radiates to sides and posterior especially in left knee. Right more achy than left. Tried advil, tylenol, brace. No skin changes, fever, other complaints.  4/3: Patient reports right knee pain is down to 1/10. However, feels like left knee injection did not do anything - pain 10/10. Very slow in moving.  Pain is sharp, medial. Worse especially with stairs. No skin changes, fever.  4/10: Patient returns with continued left knee pain and to review MRI. Pain is 10/10, lots of difficulty getting around. Swelling not as bad as before. No skin changes.  Past Medical History  Diagnosis Date  . GOITER, NONTOXIC MULTINODULAR 09/22/2007  . DIABETES MELLITUS, TYPE II 09/13/2007  . FATTY LIVER DISEASE 10/29/2008  . DISEASE, CHRONIC NONALCOHOLIC LIVER NOS 0000000  . Cataract     starting  . Hyperlipidemia     Current Outpatient Prescriptions on File Prior to Visit  Medication Sig Dispense Refill  . aspirin 81 MG chewable tablet Chew 81 mg by mouth daily.    Marland Kitchen glucose blood test strip Use as instructed bid 100 each 12  . insulin aspart (NOVOLOG FLEXPEN) 100 UNIT/ML FlexPen Inject 10 Units into the skin 3 (three) times daily with meals. And pen needles 4/day 15 mL 11  . Insulin Glargine (BASAGLAR KWIKPEN) 100 UNIT/ML SOPN Inject 0.32 mLs (32 Units total) into the skin at bedtime. 30 mL 2  . Insulin Pen Needle 31G X 5 MM MISC by Does not apply route 4 (four) times daily.    . pravastatin (PRAVACHOL) 20 MG tablet TAKE 1 TABLET (20 MG TOTAL) BY MOUTH DAILY. 90 tablet 0   No current facility-administered medications on file prior to visit.    Past Surgical  History  Procedure Laterality Date  . Wisdom tooth extraction      Allergies  Allergen Reactions  . Shrimp [Shellfish Allergy] Hives and Swelling    Social History   Social History  . Marital Status: Married    Spouse Name: N/A  . Number of Children: N/A  . Years of Education: N/A   Occupational History  . 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 Smoking status: Former Research scientist (life sciences)  . Smokeless tobacco: Never Used  . Alcohol Use: 0.0 oz/week    0 Standard drinks or equivalent per week     Comment: rare   . Drug Use: No  . Sexual Activity: Not on file   Other Topics Concern  . Not on file   Social History Narrative    Family History  Problem Relation Age of Onset  . Diabetes Other     1st degree relative  . Hypertension Other   . Heart disease Other     FH of Cardiac Murmur  . Cancer Other     Colon Cancer- cousin   . Esophageal cancer Maternal Uncle   . Rectal cancer Neg Hx   . Stomach cancer Neg Hx   . Colon polyps Neg Hx     BP 100/69 mmHg  Pulse 111  Ht 6\' 1"  (1.854 m)  Wt 200 lb (90.719 kg)  BMI 26.39 kg/m2  Review of Systems: See HPI above.    Objective:  Physical Exam:  Gen: NAD, comfortable in exam room  Left knee: Mild effusion.  No other gross deformity, ecchymoses. TTP medial joint line, less post patellar facets.. FROM. NV intact distally.    Assessment & Plan:  1. Left knee pain - We reviewed his MRI and discussed options.  He does have arthritis that is moderate as expected.  Also with medial meniscus tear and osseus loose body posteromedial aspect of joint.  He would like to try injection one more time.  He have discussed physical therapy as well.  If not improving with injection will go ahead with ortho referral to discuss arthroscopy.  After informed written consent, patient was lying supine on exam table. Left knee was prepped with alcohol swab and utilizing superolateral approach with  ultrasound guidance, patient's left knee was injected intraarticularly with 3:1 marcaine: depomedrol. Patient tolerated the procedure well without immediate complications.

## 2016-03-30 NOTE — Assessment & Plan Note (Signed)
We reviewed his MRI and discussed options.  He does have arthritis that is moderate as expected.  Also with medial meniscus tear and osseus loose body posteromedial aspect of joint.  He would like to try injection one more time.  He have discussed physical therapy as well.  If not improving with injection will go ahead with ortho referral to discuss arthroscopy.  After informed written consent, patient was lying supine on exam table. Left knee was prepped with alcohol swab and utilizing superolateral approach with ultrasound guidance, patient's left knee was injected intraarticularly with 3:1 marcaine: depomedrol. Patient tolerated the procedure well without immediate complications.

## 2016-04-05 ENCOUNTER — Encounter: Payer: Self-pay | Admitting: Family Medicine

## 2016-04-05 ENCOUNTER — Ambulatory Visit (INDEPENDENT_AMBULATORY_CARE_PROVIDER_SITE_OTHER): Payer: BLUE CROSS/BLUE SHIELD | Admitting: Family Medicine

## 2016-04-05 ENCOUNTER — Telehealth: Payer: Self-pay | Admitting: Family Medicine

## 2016-04-05 VITALS — BP 126/88 | HR 83 | Temp 98.2°F | Ht 73.0 in | Wt 215.6 lb

## 2016-04-05 DIAGNOSIS — R809 Proteinuria, unspecified: Secondary | ICD-10-CM

## 2016-04-05 DIAGNOSIS — Z0000 Encounter for general adult medical examination without abnormal findings: Secondary | ICD-10-CM | POA: Diagnosis not present

## 2016-04-05 DIAGNOSIS — E1151 Type 2 diabetes mellitus with diabetic peripheral angiopathy without gangrene: Secondary | ICD-10-CM

## 2016-04-05 DIAGNOSIS — E7800 Pure hypercholesterolemia, unspecified: Secondary | ICD-10-CM

## 2016-04-05 DIAGNOSIS — N529 Male erectile dysfunction, unspecified: Secondary | ICD-10-CM

## 2016-04-05 DIAGNOSIS — E1165 Type 2 diabetes mellitus with hyperglycemia: Secondary | ICD-10-CM

## 2016-04-05 DIAGNOSIS — E785 Hyperlipidemia, unspecified: Secondary | ICD-10-CM

## 2016-04-05 DIAGNOSIS — I10 Essential (primary) hypertension: Secondary | ICD-10-CM | POA: Diagnosis not present

## 2016-04-05 DIAGNOSIS — Z23 Encounter for immunization: Secondary | ICD-10-CM | POA: Diagnosis not present

## 2016-04-05 DIAGNOSIS — IMO0002 Reserved for concepts with insufficient information to code with codable children: Secondary | ICD-10-CM | POA: Insufficient documentation

## 2016-04-05 LAB — LIPID PANEL
CHOLESTEROL: 201 mg/dL — AB (ref 0–200)
HDL: 69.7 mg/dL (ref >39.00)
LDL CALC: 116 mg/dL — AB (ref 0–99)
NonHDL: 131.62
TRIGLYCERIDES: 77 mg/dL (ref 0.0–149.0)
Total CHOL/HDL Ratio: 3
VLDL: 15.4 mg/dL (ref 0.0–40.0)

## 2016-04-05 LAB — POCT URINALYSIS DIPSTICK
BILIRUBIN UA: NEGATIVE
Blood, UA: NEGATIVE
GLUCOSE UA: NEGATIVE
KETONES UA: NEGATIVE
Leukocytes, UA: NEGATIVE
Nitrite, UA: NEGATIVE
PH UA: 6
Spec Grav, UA: 1.025
Urobilinogen, UA: 0.2

## 2016-04-05 LAB — COMPREHENSIVE METABOLIC PANEL
ALBUMIN: 3.9 g/dL (ref 3.5–5.2)
ALK PHOS: 80 U/L (ref 39–117)
ALT: 29 U/L (ref 0–53)
AST: 21 U/L (ref 0–37)
BUN: 14 mg/dL (ref 6–23)
CALCIUM: 9.5 mg/dL (ref 8.4–10.5)
CO2: 31 mEq/L (ref 19–32)
Chloride: 103 mEq/L (ref 96–112)
Creatinine, Ser: 0.87 mg/dL (ref 0.40–1.50)
GFR: 119.09 mL/min (ref >60.00)
Glucose, Bld: 74 mg/dL (ref 70–99)
POTASSIUM: 4.1 meq/L (ref 3.5–5.1)
Sodium: 142 mEq/L (ref 135–145)
TOTAL PROTEIN: 7 g/dL (ref 6.0–8.3)
Total Bilirubin: 0.6 mg/dL (ref 0.2–1.2)

## 2016-04-05 LAB — MICROALBUMIN / CREATININE URINE RATIO
Creatinine,U: 197.7 mg/dL
MICROALB UR: 22.4 mg/dL — AB (ref 0.0–1.9)
Microalb Creat Ratio: 11.3 mg/g (ref 0.0–30.0)

## 2016-04-05 LAB — HEMOGLOBIN A1C: HEMOGLOBIN A1C: 8.8 % — AB (ref 4.6–6.5)

## 2016-04-05 MED ORDER — INSULIN ASPART 100 UNIT/ML FLEXPEN: 10.0000 [IU] | PEN_INJECTOR | Freq: Three times a day (TID) | SUBCUTANEOUS | Status: DC

## 2016-04-05 NOTE — Patient Instructions (Addendum)
Basic Carbohydrate Counting for Diabetes Mellitus Carbohydrate counting is a method for keeping track of the amount of carbohydrates you eat. Eating carbohydrates naturally increases the level of sugar (glucose) in your blood, so it is important for you to know the amount that is okay for you to have in every meal. Carbohydrate counting helps keep the level of glucose in your blood within normal limits. The amount of carbohydrates allowed is different for every person. A dietitian can help you calculate the amount that is right for you. Once you know the amount of carbohydrates you can have, you can count the carbohydrates in the foods you want to eat. Carbohydrates are found in the following foods:  Grains, such as breads and cereals.  Dried beans and soy products.  Starchy vegetables, such as potatoes, peas, and corn.  Fruit and fruit juices.  Milk and yogurt.  Sweets and snack foods, such as cake, cookies, candy, chips, soft drinks, and fruit drinks. CARBOHYDRATE COUNTING There are two ways to count the carbohydrates in your food. You can use either of the methods or a combination of both. Reading the "Nutrition Facts" on [place] The "Nutrition Facts" is an area that is included on the labels of almost all packaged food and beverages in the [place]. It includes the serving size of that food or beverage and information about the nutrients in each serving of the food, including the grams (g) of carbohydrate per serving.  Decide the number of servings of this food or beverage that you will be able to eat or drink. Multiply that number of servings by the number of grams of carbohydrate that is listed on the label for that serving. The total will be the amount of carbohydrates you will be having when you eat or drink this food or beverage. Learning Standard Serving Sizes of Food When you eat food that is not packaged or does not include "Nutrition Facts" on the label, you need to  measure the servings in order to count the amount of carbohydrates.A serving of most carbohydrate-rich foods contains about 15 g of carbohydrates. The following list includes serving sizes of carbohydrate-rich foods that provide 15 g ofcarbohydrate per serving:   1 slice of bread (1 oz) or 1 six-inch tortilla.    of a hamburger bun or English muffin.  4-6 crackers.   cup unsweetened dry cereal.    cup hot cereal.   cup rice or pasta.    cup mashed potatoes or  of a large baked potato.  1 cup fresh fruit or one small piece of fruit.    cup canned or frozen fruit or fruit juice.  1 cup milk.   cup plain fat-free yogurt or yogurt sweetened with artificial sweeteners.   cup cooked dried beans or starchy vegetable, such as peas, corn, or potatoes.  Decide the number of standard-size servings that you will eat. Multiply that number of servings by 15 (the grams of carbohydrates in that serving). For example, if you eat 2 cups of strawberries, you will have eaten 2 servings and 30 g of carbohydrates (2 servings x 15 g = 30 g). For foods such as soups and casseroles, in which more than one food is mixed in, you will need to count the carbohydrates in each food that is included. EXAMPLE OF CARBOHYDRATE COUNTING Sample Dinner  3 oz chicken breast.   cup of brown rice.   cup of corn.  1 cup milk.   1 cup strawberries with  sugar-free whipped topping.  Carbohydrate Calculation Step 1: Identify the foods that contain carbohydrates:   Rice.   Corn.   Milk.   Strawberries. Step 2:Calculate the number of servings eaten of each:   2 servings of rice.   1 serving of corn.   1 serving of milk.   1 serving of strawberries. Step 3: Multiply each of those number of servings by 15 g:   2 servings of rice x 15 g = 30 g.   1 serving of corn x 15 g = 15 g.   1 serving of milk x 15 g = 15 g.   1 serving of strawberries x 15 g = 15 g. Step 4: Add  together all of the amounts to find the total grams of carbohydrates eaten: 30 g + 15 g + 15 g + 15 g = 75 g.   This information is not intended to replace advice given to you by your health care provider. Make sure you discuss any questions you have with your health care provider.   Document Released: 12/06/2005 Document Revised: 12/27/2014 Document Reviewed: 11/02/2013 Elsevier Interactive Patient Education 2016 [place].  Diabetes and Standards of Medical Care Diabetes is complicated. You may find that your diabetes team includes a dietitian, nurse, diabetes educator, eye doctor, and more. To help everyone know what is going on and to help you get the care you deserve, the following schedule of care was developed to help keep you on track. Below are the tests, exams, vaccines, medicines, education, and plans you will need. HbA1c test This test shows how well you have controlled your glucose over the past 2-3 months. It is used to see if your diabetes management plan needs to be adjusted.   It is performed at least 2 times a year if you are meeting treatment goals.  It is performed 4 times a year if therapy has changed or if you are not meeting treatment goals. Blood pressure test  This test is performed at every routine medical visit. The goal is less than 140/90 mm Hg for most people, but 130/80 mm Hg in some cases. Ask your health care provider about your goal. Dental exam  Follow up with the dentist regularly. Eye exam  If you are diagnosed with type 1 diabetes as a child, get an exam upon reaching the age of 16 years or older and having had diabetes for 3-5 years. Yearly eye exams are recommended after that initial eye exam.  If you are diagnosed with type 1 diabetes as an adult, get an exam within 5 years of diagnosis and then yearly.  If you are diagnosed with type 2 diabetes, get an exam as soon as possible after the diagnosis and then yearly. Foot care exam  Visual foot  exams are performed at every routine medical visit. The exams check for cuts, injuries, or other problems with the feet.  You should have a complete foot exam performed every year. This exam includes an inspection of the structure and skin of your feet, a check of the pulses in your feet, and a check of the sensation in your feet.  Type 1 diabetes: The first exam is performed 5 years after diagnosis.  Type 2 diabetes: The first exam is performed at the time of diagnosis.  Check your feet nightly for cuts, injuries, or other problems with your feet. Tell your health care provider if anything is not healing. Kidney function test (urine microalbumin)  This test  is performed once a year.  Type 1 diabetes: The first test is performed 5 years after diagnosis.  Type 2 diabetes: The first test is performed at the time of diagnosis.  A serum creatinine and estimated glomerular filtration rate (eGFR) test is done once a year to assess the level of chronic kidney disease (CKD), if present. Lipid profile (cholesterol, HDL, LDL, triglycerides)  Performed every 5 years for most people.  The goal for LDL is less than 100 mg/dL. If you are at high risk, the goal is less than 70 mg/dL.  The goal for HDL is 40 mg/dL-50 mg/dL for men and 50 mg/dL-60 mg/dL for women. An HDL cholesterol of 60 mg/dL or higher gives some protection against heart disease.  The goal for triglycerides is less than 150 mg/dL. Immunizations  The flu (influenza) vaccine is recommended yearly for every person 10 months of age or older who has diabetes.  The pneumonia (pneumococcal) vaccine is recommended for every person 71 years of age or older who has diabetes. Adults 107 years of age or older may receive the pneumonia vaccine as a series of two separate shots.  The hepatitis B vaccine is recommended for adults shortly after they have been diagnosed with diabetes.  The Tdap (tetanus, diphtheria, and pertussis) vaccine should be  given:  According to normal childhood vaccination schedules, for children.  Every 10 years, for adults who have diabetes. Diabetes self-management education  Education is recommended at diagnosis and ongoing as needed. Treatment plan  Your treatment plan is reviewed at every medical visit.   This information is not intended to replace advice given to you by your health care provider. Make sure you discuss any questions you have with your health care provider.   Document Released: 10/03/2009 Document Revised: 12/27/2014 Document Reviewed: 05/08/2013 Elsevier Interactive Patient Education [employer].

## 2016-04-05 NOTE — Progress Notes (Signed)
Pre visit review using our clinic review tool, if applicable. No additional management support is needed unless otherwise documented below in the visit note. 

## 2016-04-05 NOTE — Telephone Encounter (Signed)
Out check out pt mentioned a referral to Urology. He says that he forgot to mention it in his appt. Pt says that he is having male issues.   Pt says that someone suggested Dr. Gaynelle Arabian to him.    Please advise.

## 2016-04-05 NOTE — Assessment & Plan Note (Signed)
Pt is not taking meds as directed Check labs

## 2016-04-05 NOTE — Telephone Encounter (Signed)
ED.   KP

## 2016-04-05 NOTE — Telephone Encounter (Signed)
Ok to put referral in--- we we would have done more today if he had told me

## 2016-04-05 NOTE — Telephone Encounter (Signed)
Ref placed.      KP 

## 2016-04-05 NOTE — Assessment & Plan Note (Signed)
Pt stopped cholesterol meds on his own Check labs today

## 2016-04-05 NOTE — Telephone Encounter (Signed)
i need to know a specific reason in order to put referral in

## 2016-04-05 NOTE — Telephone Encounter (Signed)
Please advise      KP 

## 2016-04-05 NOTE — Progress Notes (Signed)
Patient ID: Kenneth Durham, male    DOB: 01-16-1966  Age: 50 y.o. MRN: [id number]    Subjective:  Subjective HPI Kenneth Durham presents for f/u dm, cholesterol and htn..   Pt is trying to find a different endo. His sugars are better but still not good.     Review of Systems  Constitutional: Negative for diaphoresis, appetite change, fatigue and unexpected weight change.  Eyes: Negative for pain, redness and visual disturbance.  Respiratory: Negative for cough, chest tightness, shortness of breath and wheezing.   Cardiovascular: Negative for chest pain, palpitations and leg swelling.  Endocrine: Negative for cold intolerance, heat intolerance, polydipsia, polyphagia and polyuria.  Genitourinary: Negative for dysuria, frequency and difficulty urinating.  Neurological: Negative for dizziness, light-headedness, numbness and headaches.  Psychiatric/Behavioral: Negative for decreased concentration. The patient is not nervous/anxious.     History Past Medical History  Diagnosis Date  . GOITER, NONTOXIC MULTINODULAR 09/22/2007  . DIABETES MELLITUS, TYPE II 09/13/2007  . FATTY LIVER DISEASE 10/29/2008  . DISEASE, CHRONIC NONALCOHOLIC LIVER NOS 0000000  . Cataract     starting  . Hyperlipidemia     He has past surgical history that includes Wisdom tooth extraction.   His family history includes Cancer in his other; Diabetes in his other; Esophageal cancer in his maternal uncle; Heart disease in his other; Hypertension in his other. There is no history of Rectal cancer, Stomach cancer, or Colon polyps.He reports that he has quit smoking. He has never used smokeless tobacco. He reports that he drinks alcohol. He reports that he does not use illicit drugs.  Current Outpatient Prescriptions on File Prior to Visit  Medication Sig Dispense Refill  . aspirin 81 MG chewable tablet Chew 81 mg by mouth daily.    Marland Kitchen glucose blood test strip Use as instructed bid 100 each 12  . Insulin  Glargine (BASAGLAR KWIKPEN) 100 UNIT/ML SOPN Inject 0.32 mLs (32 Units total) into the skin at bedtime. (Patient taking differently: Inject 32 Units into the skin every morning. ) 30 mL 2  . Insulin Pen Needle 31G X 5 MM MISC by Does not apply route 4 (four) times daily.    . pravastatin (PRAVACHOL) 20 MG tablet TAKE 1 TABLET (20 MG TOTAL) BY MOUTH DAILY. (Patient not taking: Reported on 04/05/2016) 90 tablet 0   No current facility-administered medications on file prior to visit.     Objective:  Objective Physical Exam  Constitutional: He is oriented to person, place, and time. Vital signs are normal. He appears well-developed and well-nourished. He is sleeping.  HENT:  Head: Normocephalic and atraumatic.  Mouth/Throat: Oropharynx is clear and moist.  Eyes: EOM are normal. Pupils are equal, round, and reactive to light.  Neck: Normal range of motion. Neck supple. No thyromegaly present.  Cardiovascular: Normal rate and regular rhythm.   No murmur heard. Pulmonary/Chest: Effort normal and breath sounds normal. No respiratory distress. He has no wheezes. He has no rales. He exhibits no tenderness.  Musculoskeletal: He exhibits edema.       Left knee: He exhibits swelling. Tenderness found.  Neurological: He is alert and oriented to person, place, and time.  Skin: Skin is warm and dry.  Psychiatric: He has a normal mood and affect. His behavior is normal. Judgment and thought content normal.  Nursing note and vitals reviewed. R big toe-- unable to feel monofilament Rest of monofilament neg BP 126/88 mmHg  Pulse 83  Temp(Src) 98.2 F (36.8 C) (Oral)  Ht  6\' 1"  (1.854 m)  Wt 215 lb 9.6 oz (97.796 kg)  BMI 28.45 kg/m2  SpO2 98% Wt Readings from Last 3 Encounters:  04/05/16 215 lb 9.6 oz (97.796 kg)  03/29/16 200 lb (90.719 kg)  03/22/16 200 lb (90.719 kg)     Lab Results  Component Value Date   GLUCOSE 160* 06/27/2015   CHOL 213* 10/03/2015   TRIG 52.0 10/03/2015   HDL 75.90  10/03/2015   LDLDIRECT 153.5 01/15/2011   LDLCALC 127* 10/03/2015   ALT 43 06/27/2015   AST 38* 06/27/2015   NA 140 06/27/2015   K 4.2 06/27/2015   CL 104 06/27/2015   CREATININE 0.76 06/27/2015   BUN 15 06/27/2015   CO2 27 06/27/2015   TSH 1.51 06/17/2015   PSA 0.31 10/03/2015   HGBA1C 7.8 02/16/2016   MICROALBUR 41.2* 10/03/2015    Dg Chest 2 View  06/18/2013  *RADIOLOGY REPORT* Clinical Data: Mass in the inferior aspect of the sternum. CHEST - 2 VIEW,LEFT RIBS - 2 VIEW,RIGHT RIBS - 2 VIEW Comparison: Chest x-ray 01/27/2009. Findings: Lung volumes are normal.  No consolidative airspace disease.  No pleural effusions.  No pneumothorax.  No pulmonary nodule or mass noted.  Pulmonary vasculature and the cardiomediastinal silhouette are within normal limits.  On the lateral projection the marker placed over the inferior aspect of the sternum is noted, but no definite underlying masses confidently identified on this plain film examination. Dedicated views of the left ribs demonstrate no acute displaced fractures, and no definite bony mass. Dedicated views of the right ribs demonstrate no definite displaced fracture and no definite bony mass. IMPRESSION: 1.  No soft tissue or bony mass is identified to correspond to the palpable abnormality in the inferior aspect of the sternum. 2.  No radiographic evidence of acute cardiopulmonary disease. Original Report Authenticated By: Vinnie Langton, M.D.   Dg Ribs Unilateral Left  06/18/2013  *RADIOLOGY REPORT* Clinical Data: Mass in the inferior aspect of the sternum. CHEST - 2 VIEW,LEFT RIBS - 2 VIEW,RIGHT RIBS - 2 VIEW Comparison: Chest x-ray 01/27/2009. Findings: Lung volumes are normal.  No consolidative airspace disease.  No pleural effusions.  No pneumothorax.  No pulmonary nodule or mass noted.  Pulmonary vasculature and the cardiomediastinal silhouette are within normal limits.  On the lateral projection the marker placed over the inferior aspect of  the sternum is noted, but no definite underlying masses confidently identified on this plain film examination. Dedicated views of the left ribs demonstrate no acute displaced fractures, and no definite bony mass. Dedicated views of the right ribs demonstrate no definite displaced fracture and no definite bony mass. IMPRESSION: 1.  No soft tissue or bony mass is identified to correspond to the palpable abnormality in the inferior aspect of the sternum. 2.  No radiographic evidence of acute cardiopulmonary disease. Original Report Authenticated By: Vinnie Langton, M.D.   Dg Ribs Unilateral Right  06/18/2013  *RADIOLOGY REPORT* Clinical Data: Mass in the inferior aspect of the sternum. CHEST - 2 VIEW,LEFT RIBS - 2 VIEW,RIGHT RIBS - 2 VIEW Comparison: Chest x-ray 01/27/2009. Findings: Lung volumes are normal.  No consolidative airspace disease.  No pleural effusions.  No pneumothorax.  No pulmonary nodule or mass noted.  Pulmonary vasculature and the cardiomediastinal silhouette are within normal limits.  On the lateral projection the marker placed over the inferior aspect of the sternum is noted, but no definite underlying masses confidently identified on this plain film examination. Dedicated views of the left  ribs demonstrate no acute displaced fractures, and no definite bony mass. Dedicated views of the right ribs demonstrate no definite displaced fracture and no definite bony mass. IMPRESSION: 1.  No soft tissue or bony mass is identified to correspond to the palpable abnormality in the inferior aspect of the sternum. 2.  No radiographic evidence of acute cardiopulmonary disease. Original Report Authenticated By: Vinnie Langton, M.D.     Assessment & Plan:  Plan I am having Kenneth Durham maintain his glucose blood, aspirin, Insulin Pen Needle, BASAGLAR KWIKPEN, pravastatin, and insulin aspart.  Meds ordered this encounter  Medications  . insulin aspart (NOVOLOG FLEXPEN) 100 UNIT/ML FlexPen    Sig:  Inject 10 Units into the skin 3 (three) times daily with meals. And pen needles 4/day    Dispense:  15 mL    Refill:  11    This is a new script, please discontinue the previous script.    Problem List Items Addressed This Visit      Unprioritized   HYPERCHOLESTEROLEMIA    Pt stopped cholesterol meds on his own Check labs today      Preventative health care   Relevant Orders   Ambulatory referral to Gastroenterology   DM (diabetes mellitus) type II uncontrolled, periph vascular disorder ([place]) - Primary    Pt is not taking meds as directed Check labs       Relevant Medications   insulin aspart (NOVOLOG FLEXPEN) 100 UNIT/ML FlexPen   Other Relevant Orders   Hemoglobin A1c   Comprehensive metabolic panel   Microalbumin / creatinine urine ratio   POCT urinalysis dipstick (Completed)   Ambulatory referral to Endocrinology    Other Visit Diagnoses    Hyperlipidemia LDL goal <70        Relevant Orders    Lipid panel    Comprehensive metabolic panel    Essential hypertension        Relevant Orders    Hemoglobin A1c    Microalbumin / creatinine urine ratio    POCT urinalysis dipstick (Completed)    Need for Tdap vaccination        Relevant Orders    Tdap vaccine greater than or equal to 7yo IM (Completed)       Follow-up: Return in about 3 months (around 07/05/2016), or if symptoms worsen or fail to improve, for hypertension, hyperlipidemia, diabetes II.  Ann Held, DO

## 2016-04-06 ENCOUNTER — Telehealth: Payer: Self-pay | Admitting: *Deleted

## 2016-04-06 NOTE — Telephone Encounter (Signed)
Received Medical Records request; forwarded to [place] for email/scan//SLS 04/18

## 2016-04-07 ENCOUNTER — Encounter: Payer: Self-pay | Admitting: Family Medicine

## 2016-04-09 ENCOUNTER — Encounter: Payer: Self-pay | Admitting: Family Medicine

## 2016-04-21 ENCOUNTER — Other Ambulatory Visit: Payer: Self-pay

## 2016-04-21 MED ORDER — PRAVASTATIN SODIUM 40 MG PO TABS: ORAL_TABLET | ORAL | Status: DC

## 2016-04-23 ENCOUNTER — Telehealth: Payer: Self-pay

## 2016-04-23 NOTE — Telephone Encounter (Signed)
Notes Recorded by Ann Held, DO on 04/11/2016 at 6:01 PM Dm not controlled--- fax to endo-- pt being referred to different endo at his request microalbumin elevated -- need 24 hr urine for protein Cholesterol--- LDL goal < 70, HDL >40, TG < 150. Diet and exercise will increase HDL and decrease LDL and TG. Fish, Fish Oil, Flaxseed oil will also help increase the HDL and decrease Triglycerides.  Recheck labs in 3 months-- inc pravachol to 40 mg daily-- 2 refills  Lipid,.cmp   Patient returned my call and is aware of his results, he has agreed to come in to pick up the 25 hour container and to increase the pravastatin.    KP

## 2016-04-26 ENCOUNTER — Telehealth: Payer: Self-pay | Admitting: Family Medicine

## 2016-04-26 ENCOUNTER — Other Ambulatory Visit: Payer: BLUE CROSS/BLUE SHIELD

## 2016-04-26 LAB — PROTEIN, URINE, 24 HOUR
PROTEIN, URINE: 28 mg/dL — AB (ref 5–25)
Protein, 24H Urine: 434 mg/24 h — ABNORMAL HIGH (ref <150)

## 2016-04-26 NOTE — Addendum Note (Signed)
Addended by: Peggyann Shoals on: 04/26/2016 09:58 AM   Modules accepted: Orders

## 2016-04-27 ENCOUNTER — Other Ambulatory Visit: Payer: Self-pay

## 2016-04-27 DIAGNOSIS — R809 Proteinuria, unspecified: Secondary | ICD-10-CM

## 2016-05-03 NOTE — Telephone Encounter (Signed)
Unfortunately it looks like we have to go ahead with orthopedic referral to discuss arthroscopy.

## 2016-05-06 ENCOUNTER — Telehealth: Payer: Self-pay | Admitting: Family Medicine

## 2016-05-06 NOTE — Telephone Encounter (Signed)
Talked to patient and he would like to do physical therapy. Will send referral.

## 2016-05-07 NOTE — Addendum Note (Signed)
Addended by: Sherrie George F on: 05/07/2016 08:42 AM   Modules accepted: Orders

## 2016-05-10 ENCOUNTER — Ambulatory Visit: Payer: BLUE CROSS/BLUE SHIELD | Admitting: Endocrinology

## 2016-05-24 ENCOUNTER — Encounter: Payer: Self-pay | Admitting: Physical Therapy

## 2016-05-24 ENCOUNTER — Ambulatory Visit: Payer: BLUE CROSS/BLUE SHIELD | Attending: Family Medicine | Admitting: Physical Therapy

## 2016-05-24 DIAGNOSIS — M25562 Pain in left knee: Secondary | ICD-10-CM | POA: Insufficient documentation

## 2016-05-24 DIAGNOSIS — R262 Difficulty in walking, not elsewhere classified: Secondary | ICD-10-CM

## 2016-05-24 DIAGNOSIS — M25662 Stiffness of left knee, not elsewhere classified: Secondary | ICD-10-CM

## 2016-05-24 NOTE — Therapy (Signed)
[place], [place], [address] Phone: [phone]   Fax:  [phone]  Physical Therapy Evaluation  Patient Details  Name: Kenneth Durham MRN: [id number] Date of Birth: December 05, 1966 Referring Provider: Barbaraann Barthel  Encounter Date: 05/24/2016      PT End of Session - 05/24/16 0834    Visit Number 1   Date for PT Re-Evaluation 07/24/16   PT Start Time 0809   PT Stop Time 0900   PT Time Calculation (min) 51 min   Activity Tolerance Patient tolerated treatment well   Behavior During Therapy [facility] for tasks assessed/performed      Past Medical History  Diagnosis Date  . GOITER, NONTOXIC MULTINODULAR 09/22/2007  . DIABETES MELLITUS, TYPE II 09/13/2007  . FATTY LIVER DISEASE 10/29/2008  . DISEASE, CHRONIC NONALCOHOLIC LIVER NOS 0000000  . Cataract     starting  . Hyperlipidemia     Past Surgical History  Procedure Laterality Date  . Wisdom tooth extraction      There were no vitals filed for this visit.       Subjective Assessment - 05/24/16 0811    Subjective Patient reports that he has had left knee pain since February, he reports that he has had left and right knee pain.  MRI showed DJD, a meniscus tear.  He reports he is having difficulty walking and doing his job.   Patient Stated Goals reports stairs is the hardest part, go up and down stairs without difficulty   Currently in Pain? Yes   Pain Score 1    Pain Location Knee   Pain Descriptors / Indicators Sore;Sharp   Pain Type Acute pain   Pain Onset More than a month ago   Pain Frequency Intermittent   Aggravating Factors  stairs and ladders cause the biggest issue, reports pain a 3-4/10 but reports mostly fear   Pain Relieving Factors rest will decrease pain   Effect of Pain on Daily Activities difficulty with job            [facility] PT Assessment - 05/24/16 0001    Assessment   Medical Diagnosis left knee pain   Referring  Provider [place]   Onset Date/Surgical Date 04/23/16   Prior Therapy no   Precautions   Precautions None   Balance Screen   Has the patient fallen in the past 6 months No   Has the patient had a decrease in activity level because of a fear of falling?  No   Is the patient reluctant to leave their home because of a fear of falling?  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,    Leisure no exercise   ROM / Strength   AROM / PROM / Strength AROM;Strength   AROM   Overall AROM Comments AROM of the left knee 5-108 degrees flexion iwth some pain with flexion   Strength   Overall Strength Comments 4-/5 with pain in the medial left knee   Flexibility   Soft Tissue Assessment /Muscle Length --  Tight HS and ITB   Palpation   Palpation comment some crepitus palpable with extension,  lateral tracking patella   Ambulation/Gait   Gait Comments has a definite limp on the left knee,  goe up and down stairs one at a time, can do step over step but c/o medial left knee pain                   [facility] Adult PT Treatment/Exercise - 05/24/16 0001    Exercises   Exercises Knee/Hip   Knee/Hip Exercises: Aerobic   Elliptical R=5 I=10 x 4 minutes   Nustep Level 5 x 5 minutes                PT Education - 05/24/16 0831    Education provided Yes   Education Details HS and ITB stretches, SAQ and QS   Person(s) Educated Patient   Methods Explanation;Demonstration;Handout   Comprehension Verbalized understanding          PT Short Term Goals - 05/24/16 0914    PT SHORT TERM GOAL #1   Title independent with initial HEP   Time 1   Period Weeks   Status New           PT Long Term Goals - 05/24/16 0914    PT LONG TERM GOAL #1   Title go up and dwon stairs step over step without  difficulty   Time 8   Period Weeks   Status New   PT LONG TERM GOAL #2   Title increase AROM of the left knee to 2-115 degrees flexion   Time 8   Period Weeks   Status New   PT LONG TERM GOAL #3   Title decrease pain 50%   Time 8   Period Weeks   Status New   PT LONG TERM GOAL #4   Title return to gym safely   Time 8   Period Weeks   Status New   PT LONG TERM GOAL #5   Title understand RICE   Time 8   Period Weeks   Status New               Plan - 05/24/16 0834    Clinical Impression Statement (p) Patient reports left knee pain since February, no specific cause, MRI showed DJD, a loose body and a medial meniscus tear.  Has some limitation in ROM, biggest issue is going up and down stairs   Rehab Potential (p) Good   PT Frequency (p) 2x / week   PT Duration (p) 8 weeks   PT Treatment/Interventions (p) ADLs/Self Care Home Management;Cryotherapy;Electrical Stimulation;Iontophoresis 4mg /ml Dexamethasone;Moist Heat;Therapeutic exercise;Therapeutic activities;Functional mobility training;Gait training;Ultrasound;Balance training;Neuromuscular re-education;Manual techniques;Taping;Passive range of motion;Patient/family education   PT Next Visit Plan (p) slowly add exercises, work on closed kinteic chain exercises and if doing open chain focus on SAQ   Consulted and Agree with Plan of Care (p) Patient      Patient will benefit from skilled therapeutic intervention in order to improve the following deficits and impairments:     Visit Diagnosis: Pain in left knee - Plan: PT plan of care cert/re-cert  Stiffness of left knee, not elsewhere classified - Plan: PT plan of care cert/re-cert  Difficulty in walking, not elsewhere classified - Plan: PT plan of care cert/re-cert     Problem List Patient Active Problem List   Diagnosis Date Noted  . DM (diabetes mellitus) type II uncontrolled, periph vascular disorder ([place]) 04/05/2016  . Bilateral knee pain 02/26/2016  .  Diabetes ([place]) 02/17/2016  . Strain of calf muscle 10/07/2015  . Preventative health care 10/03/2015  . Diabetic acidosis without coma ([place]) 06/18/2015  . High potassium 06/18/2015  .  Multinodular goiter 04/09/2013  . Hyperthyroidism 04/09/2013  . HYPERCHOLESTEROLEMIA 01/15/2011  . WRIST PAIN, BILATERAL 01/15/2011  . CHEST PAIN, PLEURITIC 01/27/2009  . FATTY LIVER DISEASE 10/29/2008  . ONYCHOMYCOSIS 04/12/2008  . EDEMA 04/12/2008  . Chronic liver disease 09/22/2007    Sumner Boast., PT 05/24/2016, 9:17 AM  [place] [address] [place], [place], [address] Phone: [phone]   Fax:  [phone]  Name: NICHLOS CHENG MRN: [id number] Date of Birth: May 24, 1966

## 2016-05-31 ENCOUNTER — Ambulatory Visit: Payer: BLUE CROSS/BLUE SHIELD | Admitting: Physical Therapy

## 2016-05-31 ENCOUNTER — Encounter: Payer: Self-pay | Admitting: Physical Therapy

## 2016-05-31 DIAGNOSIS — M25662 Stiffness of left knee, not elsewhere classified: Secondary | ICD-10-CM

## 2016-05-31 DIAGNOSIS — R262 Difficulty in walking, not elsewhere classified: Secondary | ICD-10-CM

## 2016-05-31 DIAGNOSIS — M25562 Pain in left knee: Secondary | ICD-10-CM | POA: Diagnosis not present

## 2016-05-31 NOTE — Therapy (Signed)
[place], [place], [address] Phone: [phone]   Fax:  [phone]  Physical Therapy Treatment  Patient Details  Name: Kenneth Durham MRN: [id number] Date of Birth: [birth year] Referring Provider: Barbaraann Barthel  Encounter Date: 05/31/2016      PT End of Session - 05/31/16 0842    Visit Number 2   Date for PT Re-Evaluation 07/24/16   PT Start Time 0759   PT Stop Time 0844   PT Time Calculation (min) 45 min   Activity Tolerance Patient tolerated treatment well   Behavior During Therapy [facility] for tasks assessed/performed      Past Medical History  Diagnosis Date  . GOITER, NONTOXIC MULTINODULAR 09/22/2007  . DIABETES MELLITUS, TYPE II 09/13/2007  . FATTY LIVER DISEASE 10/29/2008  . DISEASE, CHRONIC NONALCOHOLIC LIVER NOS 0000000  . Cataract     starting  . Hyperlipidemia     Past Surgical History  Procedure Laterality Date  . Wisdom tooth extraction      There were no vitals filed for this visit.      Subjective Assessment - 05/31/16 0800    Subjective "Still the L knee"    Currently in Pain? Yes   Pain Score 8    Pain Location Knee   Pain Orientation Left                         [facility] Adult PT Treatment/Exercise - 05/31/16 0001    Exercises   Exercises Knee/Hip   Knee/Hip Exercises: Aerobic   Elliptical R=5 I=10 x 4 minutes   Nustep Level 5 x 5 minutes   Knee/Hip Exercises: Machines for Strengthening   Cybex Knee Flexion 35lb 3x10    Cybex Leg Press #20 3x10    Knee/Hip Exercises: Standing   Hip Extension 2 sets;Both   Extension Limitations 3lb   Forward Step Up 10 reps;Hand Hold: 0;Left;Step Height: 4";3 sets   Other Standing Knee Exercises Standing marcn 3lb 2x10    Other Standing Knee Exercises Stranding HS curls 3lb 2x10   Knee/Hip Exercises: Seated   Sit to Sand 2 sets;10 reps;without UE support   Knee/Hip Exercises: Supine   Short Arc Quad Sets  2 sets;10 reps   Short Arc Quad Sets Limitations 3lb   Straight Leg Raises 2 sets;Left;10 reps  2nd set with #3    Other Supine Knee/Hip Exercises Hip abd/add #2 LLE 2x10                  PT Short Term Goals - 05/31/16 [id number]    PT SHORT TERM GOAL #1   Title independent with initial HEP   Status Achieved           PT Long Term Goals - 05/24/16 0914    PT LONG TERM GOAL #1   Title go up and dwon stairs step over step without difficulty   Time 8   Period Weeks   Status New   PT LONG TERM GOAL #2   Title increase AROM of the left knee to 2-115 degrees flexion   Time 8   Period Weeks   Status New   PT LONG TERM GOAL #3   Title decrease pain 50%   Time 8   Period Weeks   Status New   PT LONG TERM GOAL #4   Title return to gym safely   Time 8   Period Weeks  Status New   PT LONG TERM GOAL #5   Title understand RICE   Time 8   Period Weeks   Status New               Plan - 05/31/16 [id number]    Clinical Impression Statement Pt tolerated all interventions we, no reports of increase pain. Compensation noted with sit to stand, pt ~ to fatigue easily this date   Rehab Potential Good   PT Frequency 2x / week   PT Duration 8 weeks   PT Next Visit Plan Latetral L knee strengthening, LAQ no weight      Patient will benefit from skilled therapeutic intervention in order to improve the following deficits and impairments:  Abnormal gait, Decreased activity tolerance, Decreased balance, Decreased endurance, Difficulty walking, Pain  Visit Diagnosis: Pain in left knee  Stiffness of left knee, not elsewhere classified  Difficulty in walking, not elsewhere classified     Problem List Patient Active Problem List   Diagnosis Date Noted  . DM (diabetes mellitus) type II uncontrolled, periph vascular disorder ([place]) 04/05/2016  . Bilateral knee pain 02/26/2016  . Diabetes ([place]) 02/17/2016  . Strain of calf muscle 10/07/2015  . Preventative health care  10/03/2015  . Diabetic acidosis without coma ([place]) 06/18/2015  . High potassium 06/18/2015  . Multinodular goiter 04/09/2013  . Hyperthyroidism 04/09/2013  . HYPERCHOLESTEROLEMIA 01/15/2011  . WRIST PAIN, BILATERAL 01/15/2011  . CHEST PAIN, PLEURITIC 01/27/2009  . FATTY LIVER DISEASE 10/29/2008  . ONYCHOMYCOSIS 04/12/2008  . EDEMA 04/12/2008  . Chronic liver disease 09/22/2007    Scot Jun, PTA  05/31/2016, 8:46 AM  [place], [place], [address] Phone: [phone]   Fax:  [phone]  Name: Kenneth Durham MRN: [id number] Date of Birth: 01-29-1966

## 2016-06-02 ENCOUNTER — Ambulatory Visit: Payer: BLUE CROSS/BLUE SHIELD | Admitting: Physical Therapy

## 2016-06-02 ENCOUNTER — Encounter: Payer: Self-pay | Admitting: Physical Therapy

## 2016-06-02 DIAGNOSIS — M25562 Pain in left knee: Secondary | ICD-10-CM

## 2016-06-02 DIAGNOSIS — R262 Difficulty in walking, not elsewhere classified: Secondary | ICD-10-CM

## 2016-06-02 DIAGNOSIS — M25662 Stiffness of left knee, not elsewhere classified: Secondary | ICD-10-CM

## 2016-06-02 NOTE — Therapy (Signed)
[place], [place], [address] Phone: [phone]   Fax:  [phone]  Physical Therapy Treatment  Patient Details  Name: Kenneth Durham MRN: [id number] Date of Birth: Feb 12, 1966 Referring Provider: Barbaraann Barthel  Encounter Date: 06/02/2016      PT End of Session - 06/02/16 1343    Visit Number 3   Date for PT Re-Evaluation 07/24/16   PT Start Time 1300   PT Stop Time 1344   PT Time Calculation (min) 44 min   Activity Tolerance Patient tolerated treatment well   Behavior During Therapy [facility] for tasks assessed/performed      Past Medical History  Diagnosis Date  . GOITER, NONTOXIC MULTINODULAR 09/22/2007  . DIABETES MELLITUS, TYPE II 09/13/2007  . FATTY LIVER DISEASE 10/29/2008  . DISEASE, CHRONIC NONALCOHOLIC LIVER NOS 0000000  . Cataract     starting  . Hyperlipidemia     Past Surgical History  Procedure Laterality Date  . Wisdom tooth extraction      There were no vitals filed for this visit.      Subjective Assessment - 06/02/16 1300    Subjective "Doing about the same"   Currently in Pain? Yes   Pain Score 8    Pain Location Knee                         [facility] Adult PT Treatment/Exercise - 06/02/16 0001    High Level Balance   High Level Balance Comments single leg stance LLe with ball toss 2x20   Exercises   Exercises Knee/Hip   Knee/Hip Exercises: Aerobic   Elliptical R=5 I=10 x 6 minutes   Nustep Level 5 x 5 minutes   Knee/Hip Exercises: Machines for Strengthening   Cybex Knee Flexion 15lb LLE only 2x15   Cybex Leg Press #30 3x15; LLE only 20lb x15     Knee/Hip Exercises: Standing   Hip ADduction Left;15 reps   Hip ADduction Limitations 5lb   Hip Abduction Left;15 reps   Abduction Limitations 5lb   Hip Extension Left;15 reps   Extension Limitations 5lb   Forward Step Up 10 reps;Hand Hold: 0;Left;3 sets;Step Height: 6"   Other Standing Knee  Exercises controlled descents 4 in with LLE 2x10    Other Standing Knee Exercises Standing hip flex then knee exe 2lb 2x10   Knee/Hip Exercises: Seated   Long Arc Quad 3 sets;10 reps   Long Arc Quad Weight 2 lbs.                  PT Short Term Goals - 05/31/16 [id number]    PT SHORT TERM GOAL #1   Title independent with initial HEP   Status Achieved           PT Long Term Goals - 05/24/16 0914    PT LONG TERM GOAL #1   Title go up and dwon stairs step over step without difficulty   Time 8   Period Weeks   Status New   PT LONG TERM GOAL #2   Title increase AROM of the left knee to 2-115 degrees flexion   Time 8   Period Weeks   Status New   PT LONG TERM GOAL #3   Title decrease pain 50%   Time 8   Period Weeks   Status New   PT LONG TERM GOAL #4   Title return to gym safely   Time  8   Period Weeks   Status New   PT LONG TERM GOAL #5   Title understand RICE   Time 8   Period Weeks   Status New               Plan - 06/02/16 1343    Clinical Impression Statement Pt with a progression to single leg interventions. Pt does reports that his L knee did feel more stable on elliptical. Some instability with single let stance with ball toss. Pt does report little anterior knee pain with controlled descents.   Rehab Potential Good   PT Frequency 2x / week   PT Duration 8 weeks   PT Next Visit Plan Latetral L knee strengthening, LAQ      Patient will benefit from skilled therapeutic intervention in order to improve the following deficits and impairments:  Abnormal gait, Decreased activity tolerance, Decreased balance, Decreased endurance, Difficulty walking, Pain  Visit Diagnosis: Pain in left knee  Stiffness of left knee, not elsewhere classified  Difficulty in walking, not elsewhere classified     Problem List Patient Active Problem List   Diagnosis Date Noted  . DM (diabetes mellitus) type II uncontrolled, periph vascular disorder ([place])  04/05/2016  . Bilateral knee pain 02/26/2016  . Diabetes ([place]) 02/17/2016  . Strain of calf muscle 10/07/2015  . Preventative health care 10/03/2015  . Diabetic acidosis without coma ([place]) 06/18/2015  . High potassium 06/18/2015  . Multinodular goiter 04/09/2013  . Hyperthyroidism 04/09/2013  . HYPERCHOLESTEROLEMIA 01/15/2011  . WRIST PAIN, BILATERAL 01/15/2011  . CHEST PAIN, PLEURITIC 01/27/2009  . FATTY LIVER DISEASE 10/29/2008  . ONYCHOMYCOSIS 04/12/2008  . EDEMA 04/12/2008  . Chronic liver disease 09/22/2007    Scot Jun, PTA  06/02/2016, 1:47 PM  [place], [place], [address] Phone: [phone]   Fax:  [phone]  Name: Kenneth Durham MRN: [id number] Date of Birth: [birth year]

## 2016-06-07 ENCOUNTER — Ambulatory Visit: Payer: BLUE CROSS/BLUE SHIELD | Admitting: Physical Therapy

## 2016-06-07 ENCOUNTER — Encounter: Payer: Self-pay | Admitting: Physical Therapy

## 2016-06-07 DIAGNOSIS — M25562 Pain in left knee: Secondary | ICD-10-CM | POA: Diagnosis not present

## 2016-06-07 DIAGNOSIS — M25662 Stiffness of left knee, not elsewhere classified: Secondary | ICD-10-CM

## 2016-06-07 DIAGNOSIS — R262 Difficulty in walking, not elsewhere classified: Secondary | ICD-10-CM

## 2016-06-07 NOTE — Therapy (Signed)
[place], [place], [address] Phone: [phone]   Fax:  [phone]  Physical Therapy Treatment  Patient Details  Name: Kenneth Durham MRN: [id number] Date of Birth: 06/30/1966 Referring Provider: Barbaraann Barthel  Encounter Date: 06/07/2016      PT End of Session - 06/07/16 0841    Visit Number 4   Date for PT Re-Evaluation 07/24/16   PT Start Time 0800   PT Stop Time 0844   PT Time Calculation (min) 44 min   Activity Tolerance Patient tolerated treatment well   Behavior During Therapy [facility] for tasks assessed/performed      Past Medical History  Diagnosis Date  . GOITER, NONTOXIC MULTINODULAR 09/22/2007  . DIABETES MELLITUS, TYPE II 09/13/2007  . FATTY LIVER DISEASE 10/29/2008  . DISEASE, CHRONIC NONALCOHOLIC LIVER NOS 0000000  . Cataract     starting  . Hyperlipidemia     Past Surgical History  Procedure Laterality Date  . Wisdom tooth extraction      There were no vitals filed for this visit.      Subjective Assessment - 06/07/16 0759    Subjective "Doing about the same"   Currently in Pain? No/denies   Pain Score 0-No pain   Pain Location Knee   Pain Orientation Left            [facility] PT Assessment - 06/07/16 0001    AROM   Overall AROM Comments AROM of the left knee 2-118 degrees flexion iwth some pain with flexion   Strength   Overall Strength Comments 5/5                     [facility] Adult PT Treatment/Exercise - 06/07/16 0001    Ambulation/Gait   Stairs Yes   Stairs Assistance 5: Supervision   Stair Management Technique One rail Right;Alternating pattern   Number of Stairs 24   Height of Stairs 6   Gait Comments 2 flights, lands on RLE hard when descending   Knee/Hip Exercises: Aerobic   Elliptical R=5 I=10 x 4 minutes   Nustep Level 5 x 5 minutes   Knee/Hip Exercises: Machines for Strengthening   Cybex Knee Extension UP with 2 down with LLE 15lb  2x10; LLE only 5lb x15    Cybex Knee Flexion 15lb LLE only 2x15   Cybex Leg Press #40 2x15; LLE only 20lb x15     Knee/Hip Exercises: Standing   Other Standing Knee Exercises controlled descents 4 then 6 inch with LLE 2x10    Other Standing Knee Exercises Standing hip abd with black tband 2x10; RLE heel raises 2x20                  PT Short Term Goals - 05/31/16 [id number]    PT SHORT TERM GOAL #1   Title independent with initial HEP   Status Achieved           PT Long Term Goals - 05/24/16 0914    PT LONG TERM GOAL #1   Title go up and dwon stairs step over step without difficulty   Time 8   Period Weeks   Status New   PT LONG TERM GOAL #2   Title increase AROM of the left knee to 2-115 degrees flexion   Time 8   Period Weeks   Status New   PT LONG TERM GOAL #3   Title decrease pain 50%   Time 8  Period Weeks   Status New   PT LONG TERM GOAL #4   Title return to gym safely   Time 8   Period Weeks   Status New   PT LONG TERM GOAL #5   Title understand RICE   Time 8   Period Weeks   Status New               Plan - 06/07/16 [id number]    Clinical Impression Statement Pt able to performed all exercises well and without pain. Pt reports that he knee feels unstable and he lacks confidence with it. Does land hard on RLE when descend stairs. Pt uses LUE to assist with controlled descents.   Rehab Potential Good   PT Frequency 2x / week   PT Duration 8 weeks   PT Next Visit Plan Latetral L knee strengthening, LAQ      Patient will benefit from skilled therapeutic intervention in order to improve the following deficits and impairments:  Abnormal gait, Decreased activity tolerance, Decreased balance, Decreased endurance, Difficulty walking, Pain  Visit Diagnosis: Pain in left knee  Stiffness of left knee, not elsewhere classified  Difficulty in walking, not elsewhere classified     Problem List Patient Active Problem List   Diagnosis Date Noted  .  DM (diabetes mellitus) type II uncontrolled, periph vascular disorder ([place]) 04/05/2016  . Bilateral knee pain 02/26/2016  . Diabetes ([place]) 02/17/2016  . Strain of calf muscle 10/07/2015  . Preventative health care 10/03/2015  . Diabetic acidosis without coma ([place]) 06/18/2015  . High potassium 06/18/2015  . Multinodular goiter 04/09/2013  . Hyperthyroidism 04/09/2013  . HYPERCHOLESTEROLEMIA 01/15/2011  . WRIST PAIN, BILATERAL 01/15/2011  . CHEST PAIN, PLEURITIC 01/27/2009  . FATTY LIVER DISEASE 10/29/2008  . ONYCHOMYCOSIS 04/12/2008  . EDEMA 04/12/2008  . Chronic liver disease 09/22/2007    Scot Jun, PTA  06/07/2016, 8:45 AM  [place], [place], [address] Phone: [phone]   Fax:  [phone]  Name: Kenneth Durham MRN: [id number] Date of Birth: [birth year]

## 2016-06-09 ENCOUNTER — Ambulatory Visit: Payer: BLUE CROSS/BLUE SHIELD | Admitting: Physical Therapy

## 2016-06-09 ENCOUNTER — Encounter: Payer: Self-pay | Admitting: Physical Therapy

## 2016-06-09 DIAGNOSIS — M25562 Pain in left knee: Secondary | ICD-10-CM

## 2016-06-09 DIAGNOSIS — M25662 Stiffness of left knee, not elsewhere classified: Secondary | ICD-10-CM

## 2016-06-09 DIAGNOSIS — R262 Difficulty in walking, not elsewhere classified: Secondary | ICD-10-CM

## 2016-06-09 NOTE — Therapy (Signed)
[place], [place], [address] Phone: [phone]   Fax:  [phone]  Physical Therapy Treatment  Patient Details  Name: Kenneth Durham MRN: [id number] Date of Birth: February 07, 1966 Referring Provider: Barbaraann Barthel  Encounter Date: 06/09/2016      PT End of Session - 06/09/16 0842    Visit Number 5   Date for PT Re-Evaluation 07/24/16   PT Start Time 0800   PT Stop Time 0844   PT Time Calculation (min) 44 min   Activity Tolerance Patient tolerated treatment well   Behavior During Therapy [facility] for tasks assessed/performed      Past Medical History  Diagnosis Date  . GOITER, NONTOXIC MULTINODULAR 09/22/2007  . DIABETES MELLITUS, TYPE II 09/13/2007  . FATTY LIVER DISEASE 10/29/2008  . DISEASE, CHRONIC NONALCOHOLIC LIVER NOS 0000000  . Cataract     starting  . Hyperlipidemia     Past Surgical History  Procedure Laterality Date  . Wisdom tooth extraction      There were no vitals filed for this visit.      Subjective Assessment - 06/09/16 0758    Subjective "Still abut the same, dose not seem to get any better" "No pain just discomfort"   Currently in Pain? No/denies   Pain Score 0-No pain                         [facility] Adult PT Treatment/Exercise - 06/09/16 0001    Exercises   Exercises Knee/Hip   Knee/Hip Exercises: Aerobic   Elliptical R=5 I=10 x 5 minutes   Recumbent Bike L2 x5 min    Knee/Hip Exercises: Machines for Strengthening   Cybex Knee Extension UP with 2 down with LLE 15lb 2x10; LLE only 5lb x15    Cybex Leg Press LLE only 20lb 2x15     Knee/Hip Exercises: Standing   Heel Raises 2 sets;20 reps  LLE only on black bar    Hip ADduction Left;15 reps   Hip ADduction Limitations 10   Hip Abduction Left;15 reps   Abduction Limitations 10   Hip Extension Left;15 reps   Extension Limitations 10   Forward Step Up 10 reps;Hand Hold: 0;Left;Step Height: 8";2  sets   Walking with Sports Cord 40lb 4 way x3    Other Standing Knee Exercises controlled descents 6 inch with LLE 2x10    Other Standing Knee Exercises Standing cable press downs 50lb 2x15    Knee/Hip Exercises: Seated   Sit to Sand 2 sets;10 reps;without UE support  10lb dumbbell                   PT Short Term Goals - 05/31/16 [id number]    PT SHORT TERM GOAL #1   Title independent with initial HEP   Status Achieved           PT Long Term Goals - 05/24/16 0914    PT LONG TERM GOAL #1   Title go up and dwon stairs step over step without difficulty   Time 8   Period Weeks   Status New   PT LONG TERM GOAL #2   Title increase AROM of the left knee to 2-115 degrees flexion   Time 8   Period Weeks   Status New   PT LONG TERM GOAL #3   Title decrease pain 50%   Time 8   Period Weeks   Status New  PT LONG TERM GOAL #4   Title return to gym safely   Time 8   Period Weeks   Status New   PT LONG TERM GOAL #5   Title understand RICE   Time 8   Period Weeks   Status New               Plan - 06/09/16 [id number]    Clinical Impression Statement Pt demos good strength and ROM with all exercises. Only reported that he had a little pain with single leg on leg press. Pt also reports that his L knee continues to feel unstable.   Rehab Potential Good   PT Frequency 2x / week   PT Duration 8 weeks   PT Next Visit Plan Lateral L knee strengthening, LAQ      Patient will benefit from skilled therapeutic intervention in order to improve the following deficits and impairments:  Abnormal gait, Decreased activity tolerance, Decreased balance, Decreased endurance, Difficulty walking, Pain  Visit Diagnosis: Pain in left knee  Stiffness of left knee, not elsewhere classified  Difficulty in walking, not elsewhere classified     Problem List Patient Active Problem List   Diagnosis Date Noted  . DM (diabetes mellitus) type II uncontrolled, periph vascular disorder  ([place]) 04/05/2016  . Bilateral knee pain 02/26/2016  . Diabetes ([place]) 02/17/2016  . Strain of calf muscle 10/07/2015  . Preventative health care 10/03/2015  . Diabetic acidosis without coma ([place]) 06/18/2015  . High potassium 06/18/2015  . Multinodular goiter 04/09/2013  . Hyperthyroidism 04/09/2013  . HYPERCHOLESTEROLEMIA 01/15/2011  . WRIST PAIN, BILATERAL 01/15/2011  . CHEST PAIN, PLEURITIC 01/27/2009  . FATTY LIVER DISEASE 10/29/2008  . ONYCHOMYCOSIS 04/12/2008  . EDEMA 04/12/2008  . Chronic liver disease 09/22/2007    Scot Jun, PTA  06/09/2016, 8:45 AM  [place], [place], [address] Phone: [phone]   Fax:  [phone]  Name: FLOYDE SHAKE MRN: [id number] Date of Birth: Mar 02, 1966

## 2016-06-14 ENCOUNTER — Ambulatory Visit: Payer: BLUE CROSS/BLUE SHIELD | Admitting: Physical Therapy

## 2016-06-14 ENCOUNTER — Encounter: Payer: Self-pay | Admitting: Physical Therapy

## 2016-06-14 DIAGNOSIS — R262 Difficulty in walking, not elsewhere classified: Secondary | ICD-10-CM

## 2016-06-14 DIAGNOSIS — M25562 Pain in left knee: Secondary | ICD-10-CM

## 2016-06-14 DIAGNOSIS — M25662 Stiffness of left knee, not elsewhere classified: Secondary | ICD-10-CM

## 2016-06-14 NOTE — Therapy (Signed)
[place], [place], [address] Phone: [phone]   Fax:  [phone]  Physical Therapy Treatment  Patient Details  Name: Kenneth Durham MRN: [id number] Date of Birth: May 15, 1966 Referring Provider: Barbaraann Barthel  Encounter Date: 06/14/2016      PT End of Session - 06/14/16 1226    Visit Number 6   Date for PT Re-Evaluation 07/24/16   PT Start Time 9678   PT Stop Time 1227   PT Time Calculation (min) 42 min      Past Medical History  Diagnosis Date  . GOITER, NONTOXIC MULTINODULAR 09/22/2007  . DIABETES MELLITUS, TYPE II 09/13/2007  . FATTY LIVER DISEASE 10/29/2008  . DISEASE, CHRONIC NONALCOHOLIC LIVER NOS 93/07/1016  . Cataract     starting  . Hyperlipidemia     Past Surgical History  Procedure Laterality Date  . Wisdom tooth extraction      There were no vitals filed for this visit.      Subjective Assessment - 06/14/16 1149    Subjective "Its all right"   Currently in Pain? No/denies   Pain Score 0-No pain                         [facility] Adult PT Treatment/Exercise - 06/14/16 0001    Ambulation/Gait   Stairs Yes   Stairs Assistance 5: Supervision   Stair Management Technique No rails;One rail Right   Number of Stairs 24   Height of Stairs 6   Gait Comments Do difficulty pt reports  L knee feels unsure   Knee/Hip Exercises: Aerobic   Elliptical R=8 I=10 x 6 minutes   Recumbent Bike L2 x5 min    Knee/Hip Exercises: Machines for Strengthening   Cybex Knee Extension UP with 2 down with LLE 15lb 2x10; LLE only 5lb x15    Cybex Knee Flexion 25lb LLE only 3x10   Cybex Leg Press LLE only 20lb 2x15     Knee/Hip Exercises: Standing   Hip Flexion Left;1 set;15 reps   Hip Flexion Limitations 10   Hip ADduction Left;15 reps   Hip ADduction Limitations 10   Hip Abduction Left;15 reps   Abduction Limitations 10   Hip Extension Left;15 reps   Extension Limitations  10   Walking with Sports Cord 40lb 4 way x3                   PT Short Term Goals - 05/31/16 5102    PT SHORT TERM GOAL #1   Title independent with initial HEP   Status Achieved           PT Long Term Goals - 06/14/16 1210    PT LONG TERM GOAL #1   Title go up and dwon stairs step over step without difficulty   Status Partially Met   PT LONG TERM GOAL #3   Title decrease pain 50%   Status Achieved               Plan - 06/14/16 1227    Clinical Impression Statement Pt continues to do well but reports L knee instability, Pt has progressed ands completed some goals.   Rehab Potential Good   PT Frequency 2x / week   PT Duration 8 weeks   PT Next Visit Plan Latetral L knee strengthening, LAQ      Patient will benefit from skilled therapeutic intervention in order to improve the  following deficits and impairments:  Abnormal gait, Decreased activity tolerance, Decreased balance, Decreased endurance, Difficulty walking, Pain  Visit Diagnosis: Stiffness of left knee, not elsewhere classified  Difficulty in walking, not elsewhere classified  Pain in left knee     Problem List Patient Active Problem List   Diagnosis Date Noted  . DM (diabetes mellitus) type II uncontrolled, periph vascular disorder ([place]) 04/05/2016  . Bilateral knee pain 02/26/2016  . Diabetes ([place]) 02/17/2016  . Strain of calf muscle 10/07/2015  . Preventative health care 10/03/2015  . Diabetic acidosis without coma ([place]) 06/18/2015  . High potassium 06/18/2015  . Multinodular goiter 04/09/2013  . Hyperthyroidism 04/09/2013  . HYPERCHOLESTEROLEMIA 01/15/2011  . WRIST PAIN, BILATERAL 01/15/2011  . CHEST PAIN, PLEURITIC 01/27/2009  . FATTY LIVER DISEASE 10/29/2008  . ONYCHOMYCOSIS 04/12/2008  . EDEMA 04/12/2008  . Chronic liver disease 09/22/2007    Scot Jun, PTA 06/14/2016, 12:29 PM  [place]  [address] [place], [place], [address] Phone: [phone]   Fax:  [phone]  Name: Kenneth Durham MRN: [id number] Date of Birth: 09-Sep-1966

## 2016-06-16 ENCOUNTER — Encounter: Payer: Self-pay | Admitting: Physical Therapy

## 2016-06-16 ENCOUNTER — Ambulatory Visit: Payer: BLUE CROSS/BLUE SHIELD | Admitting: Physical Therapy

## 2016-06-16 DIAGNOSIS — M25562 Pain in left knee: Secondary | ICD-10-CM

## 2016-06-16 DIAGNOSIS — M25662 Stiffness of left knee, not elsewhere classified: Secondary | ICD-10-CM

## 2016-06-16 DIAGNOSIS — R262 Difficulty in walking, not elsewhere classified: Secondary | ICD-10-CM

## 2016-06-16 NOTE — Therapy (Signed)
[place], [place], [address] Phone: [phone]   Fax:  [phone]  Physical Therapy Treatment  Patient Details  Name: Kenneth Durham MRN: [id number] Date of Birth: 1966/06/11 Referring Provider: Barbaraann Barthel  Encounter Date: 06/16/2016      PT End of Session - 06/16/16 1341    Visit Number 7   Date for PT Re-Evaluation 07/24/16   PT Start Time 1300   PT Stop Time 1344   PT Time Calculation (min) 44 min   Behavior During Therapy [facility] for tasks assessed/performed      Past Medical History  Diagnosis Date  . GOITER, NONTOXIC MULTINODULAR 09/22/2007  . DIABETES MELLITUS, TYPE II 09/13/2007  . FATTY LIVER DISEASE 10/29/2008  . DISEASE, CHRONIC NONALCOHOLIC LIVER NOS 0000000  . Cataract     starting  . Hyperlipidemia     Past Surgical History  Procedure Laterality Date  . Wisdom tooth extraction      There were no vitals filed for this visit.      Subjective Assessment - 06/16/16 1302    Subjective "Any time I sit for along period of time I have pain"   Currently in Pain? No/denies   Pain Score 0-No pain                         [facility] Adult PT Treatment/Exercise - 06/16/16 0001    Ambulation/Gait   Stairs Yes   Stairs Assistance 5: Supervision   Stair Management Technique No rails;One rail Right   Number of Stairs 24   Height of Stairs 6   Gait Comments No difficulty pt reports that his L knee feels unsure   High Level Balance   High Level Balance Comments LLE only on airex with ball toss 2x15   Knee/Hip Exercises: Aerobic   Elliptical R=8 I=10 x 6 minutes   Recumbent Bike L3 x5 min    Knee/Hip Exercises: Machines for Strengthening   Cybex Knee Extension UP with 2 down with LLE 20lb 2x10; LLE only 10lb x15    Cybex Knee Flexion 25lb LLE only 2x15   Cybex Leg Press LLE only 30lb 2x10     Knee/Hip Exercises: Standing   Walking with Sports Cord 50lb 4 way x3     Other Standing Knee Exercises controlled descents 6 inch with LLE 2x15    Other Standing Knee Exercises Standing hip abd/add with black Tband 2x10                  PT Short Term Goals - 05/31/16 [id number]    PT SHORT TERM GOAL #1   Title independent with initial HEP   Status Achieved           PT Long Term Goals - 06/16/16 1313    PT LONG TERM GOAL #4   Title return to gym safely   Baseline reports that he is not going back   Status On-going               Plan - 06/16/16 1341    Clinical Impression Statement Pt continues to perform all interventions with good strength and ROM. Pt reports no pain only L knee instability Pt odes have difficulty with single leg stance on airex   Rehab Potential Good   PT Frequency 2x / week   PT Duration 8 weeks   PT Next Visit Plan Latetral L knee strengthening, LAQ  Patient will benefit from skilled therapeutic intervention in order to improve the following deficits and impairments:  Abnormal gait, Decreased activity tolerance, Decreased balance, Decreased endurance, Difficulty walking, Pain  Visit Diagnosis: Stiffness of left knee, not elsewhere classified  Difficulty in walking, not elsewhere classified  Pain in left knee     Problem List Patient Active Problem List   Diagnosis Date Noted  . DM (diabetes mellitus) type II uncontrolled, periph vascular disorder ([place]) 04/05/2016  . Bilateral knee pain 02/26/2016  . Diabetes ([place]) 02/17/2016  . Strain of calf muscle 10/07/2015  . Preventative health care 10/03/2015  . Diabetic acidosis without coma ([place]) 06/18/2015  . High potassium 06/18/2015  . Multinodular goiter 04/09/2013  . Hyperthyroidism 04/09/2013  . HYPERCHOLESTEROLEMIA 01/15/2011  . WRIST PAIN, BILATERAL 01/15/2011  . CHEST PAIN, PLEURITIC 01/27/2009  . FATTY LIVER DISEASE 10/29/2008  . ONYCHOMYCOSIS 04/12/2008  . EDEMA 04/12/2008  . Chronic liver disease 09/22/2007    Scot Jun,  PTA 06/16/2016, 1:43 PM  [place], [place], [address] Phone: [phone]   Fax:  [phone]  Name: Kenneth Durham MRN: [id number] Date of Birth: 1966/09/16

## 2016-06-17 ENCOUNTER — Ambulatory Visit (INDEPENDENT_AMBULATORY_CARE_PROVIDER_SITE_OTHER): Payer: BLUE CROSS/BLUE SHIELD | Admitting: Family Medicine

## 2016-06-17 ENCOUNTER — Encounter: Payer: Self-pay | Admitting: Family Medicine

## 2016-06-17 VITALS — BP 119/84 | HR 78 | Ht 73.0 in | Wt 195.0 lb

## 2016-06-17 DIAGNOSIS — M1990 Unspecified osteoarthritis, unspecified site: Secondary | ICD-10-CM

## 2016-06-17 DIAGNOSIS — M25562 Pain in left knee: Secondary | ICD-10-CM | POA: Diagnosis not present

## 2016-06-17 DIAGNOSIS — M25561 Pain in right knee: Secondary | ICD-10-CM

## 2016-06-17 NOTE — Patient Instructions (Signed)
Finish physical therapy next week. Still focus on knee home exercises most days of the week until pain has resolved. Half-squats, half-lunges, wall sits, knee extensions, straight leg raises, knee curls. 3 sets of 10 once a day of these. Can add ankle weight to the exercises that are too easy. Follow up with me as needed at this point. Use brace as needed.

## 2016-06-18 NOTE — Assessment & Plan Note (Signed)
2/2 arthritis, medial meniscus tear.  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  Improved with physical therapy, injections though not completely better.  Finish PT next week and continue his home exercises.  Brace as needed.  Tylenol, nsaids if needed.  F/u prn.

## 2016-06-18 NOTE — Progress Notes (Signed)
PCP: Ann Held, DO  Subjective:   HPI: Patient is a 50 y.o. male here for bilateral knee pain.  3/7: Patient reports both his knees are swollen and stiff the past 3 weeks. No known injury or trauma. Up and down a lot more at his job at [employer] in this time. Pain level 5/10 on right, 10/10 left and sharp. Pain anterior but radiates to sides and posterior especially in left knee. Right more achy than left. Tried advil, tylenol, brace. No skin changes, fever, other complaints.  4/3: Patient reports right knee pain is down to 1/10. However, feels like left knee injection did not do anything - pain 10/10. Very slow in moving.  Pain is sharp, medial. Worse especially with stairs. No skin changes, fever.  4/10: Patient returns with continued left knee pain and to review MRI. Pain is 10/10, lots of difficulty getting around. Swelling not as bad as before. No skin changes.  6/29: Patient reports overall he's doing well with physical therapy. Pain level is 0/10 at rest, can get sore with a lot of work. Feels unstable with stairs. No swelling. Is using a knee brace which helps. Improves with rest. No skin changes, numbness.  Past Medical History  Diagnosis Date  . GOITER, NONTOXIC MULTINODULAR 09/22/2007  . DIABETES MELLITUS, TYPE II 09/13/2007  . FATTY LIVER DISEASE 10/29/2008  . DISEASE, CHRONIC NONALCOHOLIC LIVER NOS 0000000  . Cataract     starting  . Hyperlipidemia     Current Outpatient Prescriptions on File Prior to Visit  Medication Sig Dispense Refill  . aspirin 81 MG chewable tablet Chew 81 mg by mouth daily.    Marland Kitchen glucose blood test strip Use as instructed bid 100 each 12  . insulin aspart (NOVOLOG FLEXPEN) 100 UNIT/ML FlexPen Inject 10 Units into the skin 3 (three) times daily with meals. And pen needles 4/day 15 mL 11  . Insulin Glargine (BASAGLAR KWIKPEN) 100 UNIT/ML SOPN Inject 0.32 mLs (32 Units total) into the skin at bedtime. (Patient taking  differently: Inject 32 Units into the skin every morning. ) 30 mL 2  . Insulin Pen Needle 31G X 5 MM MISC by Does not apply route 4 (four) times daily.     No current facility-administered medications on file prior to visit.    Past Surgical History  Procedure Laterality Date  . Wisdom tooth extraction      Allergies  Allergen Reactions  . Shrimp [Shellfish Allergy] Hives and Swelling    Social History   Social History  . Marital Status: Married    Spouse Name: N/A  . Number of Children: N/A  . Years of Education: N/A   Occupational History  . 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 Smoking status: Former Research scientist (life sciences)  . Smokeless tobacco: Never Used  . Alcohol Use: 0.0 oz/week    0 Standard drinks or equivalent per week     Comment: rare   . Drug Use: No  . Sexual Activity: Not on file   Other Topics Concern  . Not on file   Social History Narrative    Family History  Problem Relation Age of Onset  . Diabetes Other     1st degree relative  . Hypertension Other   . Heart disease Other     FH of Cardiac Murmur  . Cancer Other     Colon Cancer- cousin   . Esophageal cancer Maternal Uncle   .  Rectal cancer Neg Hx   . Stomach cancer Neg Hx   . Colon polyps Neg Hx     BP 119/84 mmHg  Pulse 78  Ht 6\' 1"  (1.854 m)  Wt 195 lb (88.451 kg)  BMI 25.73 kg/m2  Review of Systems: See HPI above.    Objective:  Physical Exam:  Gen: NAD, comfortable in exam room  Left knee: Minimal effusion.  No other gross deformity, ecchymoses. Minimal TTP medial joint line, less post patellar facets.. FROM. Negative ant/post drawers.  Negative valgus/varus stress. NV intact distally.  Right knee: FROM without pain.    Assessment & Plan:  1. Left knee pain - 2/2 arthritis, medial meniscus tear.  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  Improved with physical therapy, injections though not completely  better.  Finish PT next week and continue his home exercises.  Brace as needed.  Tylenol, nsaids if needed.  F/u prn.

## 2016-06-21 ENCOUNTER — Ambulatory Visit: Payer: BLUE CROSS/BLUE SHIELD | Admitting: Physical Therapy

## 2016-06-24 ENCOUNTER — Ambulatory Visit: Payer: BLUE CROSS/BLUE SHIELD | Attending: Family Medicine | Admitting: Physical Therapy

## 2016-06-24 ENCOUNTER — Encounter: Payer: Self-pay | Admitting: Physical Therapy

## 2016-06-24 DIAGNOSIS — M25662 Stiffness of left knee, not elsewhere classified: Secondary | ICD-10-CM | POA: Diagnosis present

## 2016-06-24 DIAGNOSIS — R262 Difficulty in walking, not elsewhere classified: Secondary | ICD-10-CM | POA: Insufficient documentation

## 2016-06-24 NOTE — Therapy (Signed)
[address] [place], [place], [address] Phone: [phone]   Fax:  [phone]  Physical Therapy Treatment  Patient Details  Name: Kenneth Durham MRN: [id number] Date of Birth: [birth year] Referring Provider: Barbaraann Barthel  Encounter Date: 06/24/2016      PT End of Session - 06/24/16 1519    Visit Number 8   Date for PT Re-Evaluation 07/24/16   PT Start Time 9528   PT Stop Time 1520   PT Time Calculation (min) 35 min      Past Medical History  Diagnosis Date  . GOITER, NONTOXIC MULTINODULAR 09/22/2007  . DIABETES MELLITUS, TYPE II 09/13/2007  . FATTY LIVER DISEASE 10/29/2008  . DISEASE, CHRONIC NONALCOHOLIC LIVER NOS 41/02/2439  . Cataract     starting  . Hyperlipidemia     Past Surgical History  Procedure Laterality Date  . Wisdom tooth extraction      There were no vitals filed for this visit.      Subjective Assessment - 06/24/16 1447    Subjective MD pleased, D/C PT today and continue with HEP   Currently in Pain? No/denies                         [facility] Adult PT Treatment/Exercise - 06/24/16 0001    Knee/Hip Exercises: Aerobic   Elliptical R=8 I=10 x 6 minutes  3 fwd/3 back   Nustep L 6 6 min   Knee/Hip Exercises: Machines for Strengthening   Cybex Knee Extension UP with 2 down with LLE 20lb 2x10; LLE only 10lb x15    Cybex Knee Flexion 25lb LLE only 2x15   Other Machine leg press down with cable 50# 3 sets 15   Knee/Hip Exercises: Standing   Walking with Sports Cord 50lb 4 way x3                 PT Education - 06/24/16 1517    Education provided Yes   Education Details HEP green tband SLR,LAQ,HS curl. wall sits and squats   Person(s) Educated Patient   Methods Explanation;Demonstration   Comprehension Verbalized understanding;Returned demonstration          PT Short Term Goals - 05/31/16 0824    PT SHORT TERM GOAL #1   Title independent with  initial HEP   Status Achieved           PT Long Term Goals - 06/24/16 1517    PT LONG TERM GOAL #1   Title go up and dwon stairs step over step without difficulty   Baseline instability   Status Partially Met               Plan - 06/24/16 1519    Clinical Impression Statement All goals met except steps instability. HEP   PT Next Visit Plan D/C      Patient will benefit from skilled therapeutic intervention in order to improve the following deficits and impairments:  Abnormal gait, Decreased activity tolerance, Decreased balance, Decreased endurance, Difficulty walking, Pain  Visit Diagnosis: Stiffness of left knee, not elsewhere classified  Difficulty in walking, not elsewhere classified     Problem List Patient Active Problem List   Diagnosis Date Noted  . DM (diabetes mellitus) type II uncontrolled, periph vascular disorder ([place]) 04/05/2016  . Bilateral knee pain 02/26/2016  . Diabetes ([place]) 02/17/2016  . Strain of calf muscle 10/07/2015  . Preventative health care 10/03/2015  .  Diabetic acidosis without coma ([place]) 06/18/2015  . High potassium 06/18/2015  . Multinodular goiter 04/09/2013  . Hyperthyroidism 04/09/2013  . HYPERCHOLESTEROLEMIA 01/15/2011  . WRIST PAIN, BILATERAL 01/15/2011  . CHEST PAIN, PLEURITIC 01/27/2009  . FATTY LIVER DISEASE 10/29/2008  . ONYCHOMYCOSIS 04/12/2008  . EDEMA 04/12/2008  . Chronic liver disease 09/22/2007    PAYSEUR,ANGIE PTA 06/24/2016, 3:21 PM  [address] [place], [place], [address] Phone: [phone]   Fax:  [phone]  Name: Kenneth Durham MRN: [id number] Date of Birth: 1966-04-09

## 2016-07-05 ENCOUNTER — Encounter: Payer: Self-pay | Admitting: Family Medicine

## 2016-07-05 ENCOUNTER — Ambulatory Visit (INDEPENDENT_AMBULATORY_CARE_PROVIDER_SITE_OTHER): Payer: BLUE CROSS/BLUE SHIELD | Admitting: Family Medicine

## 2016-07-05 VITALS — BP 118/81 | HR 82 | Temp 98.1°F | Ht 73.0 in | Wt 196.4 lb

## 2016-07-05 DIAGNOSIS — E1165 Type 2 diabetes mellitus with hyperglycemia: Secondary | ICD-10-CM

## 2016-07-05 DIAGNOSIS — H6120 Impacted cerumen, unspecified ear: Secondary | ICD-10-CM | POA: Insufficient documentation

## 2016-07-05 DIAGNOSIS — H6122 Impacted cerumen, left ear: Secondary | ICD-10-CM

## 2016-07-05 DIAGNOSIS — E785 Hyperlipidemia, unspecified: Secondary | ICD-10-CM | POA: Diagnosis not present

## 2016-07-05 DIAGNOSIS — I10 Essential (primary) hypertension: Secondary | ICD-10-CM | POA: Diagnosis not present

## 2016-07-05 DIAGNOSIS — E7800 Pure hypercholesterolemia, unspecified: Secondary | ICD-10-CM

## 2016-07-05 DIAGNOSIS — M542 Cervicalgia: Secondary | ICD-10-CM | POA: Diagnosis not present

## 2016-07-05 DIAGNOSIS — IMO0002 Reserved for concepts with insufficient information to code with codable children: Secondary | ICD-10-CM

## 2016-07-05 DIAGNOSIS — E1151 Type 2 diabetes mellitus with diabetic peripheral angiopathy without gangrene: Secondary | ICD-10-CM

## 2016-07-05 LAB — LIPID PANEL
CHOLESTEROL: 181 mg/dL (ref 0–200)
HDL: 58.9 mg/dL (ref >39.00)
LDL Cholesterol: 110 mg/dL — ABNORMAL HIGH (ref 0–99)
NonHDL: 122.04
TRIGLYCERIDES: 58 mg/dL (ref 0.0–149.0)
Total CHOL/HDL Ratio: 3
VLDL: 11.6 mg/dL (ref 0.0–40.0)

## 2016-07-05 LAB — COMPREHENSIVE METABOLIC PANEL
ALK PHOS: 78 U/L (ref 39–117)
ALT: 20 U/L (ref 0–53)
AST: 19 U/L (ref 0–37)
Albumin: 4.1 g/dL (ref 3.5–5.2)
BILIRUBIN TOTAL: 0.6 mg/dL (ref 0.2–1.2)
BUN: 7 mg/dL (ref 6–23)
CALCIUM: 9.2 mg/dL (ref 8.4–10.5)
CO2: 31 meq/L (ref 19–32)
CREATININE: 0.81 mg/dL (ref 0.40–1.50)
Chloride: 103 mEq/L (ref 96–112)
GFR: 129.2 mL/min (ref >60.00)
Glucose, Bld: 172 mg/dL — ABNORMAL HIGH (ref 70–99)
Potassium: 3.6 mEq/L (ref 3.5–5.1)
Sodium: 139 mEq/L (ref 135–145)
TOTAL PROTEIN: 7.1 g/dL (ref 6.0–8.3)

## 2016-07-05 NOTE — Progress Notes (Signed)
Pre visit review using our clinic review tool, if applicable. No additional management support is needed unless otherwise documented below in the visit note. 

## 2016-07-05 NOTE — Assessment & Plan Note (Signed)
Per endo °

## 2016-07-05 NOTE — Assessment & Plan Note (Signed)
Check labs Pt never started pravastatin

## 2016-07-05 NOTE — Progress Notes (Signed)
Patient ID: Kenneth Durham, male    DOB: 05-Feb-1966  Age: 50 y.o. MRN: [id number]    Subjective:  Subjective HPI Kenneth Durham presents for f/u bp,  Pt has been sleeping on the couch because his air is out and he is unable to get it fixed.  He has a window unit and a portable unit that will be put in.  Neck is better but not completely.   Review of Systems  Constitutional: Negative for diaphoresis, appetite change, fatigue and unexpected weight change.  Eyes: Negative for pain, redness and visual disturbance.  Respiratory: Negative for cough, chest tightness, shortness of breath and wheezing.   Cardiovascular: Negative for chest pain, palpitations and leg swelling.  Endocrine: Negative for cold intolerance, heat intolerance, polydipsia, polyphagia and polyuria.  Genitourinary: Negative for dysuria, frequency and difficulty urinating.  Neurological: Negative for dizziness, light-headedness, numbness and headaches.    History Past Medical History  Diagnosis Date  . GOITER, NONTOXIC MULTINODULAR 09/22/2007  . DIABETES MELLITUS, TYPE II 09/13/2007  . FATTY LIVER DISEASE 10/29/2008  . DISEASE, CHRONIC NONALCOHOLIC LIVER NOS 0000000  . Cataract     starting  . Hyperlipidemia     He has past surgical history that includes Wisdom tooth extraction.   His family history includes Cancer in his other; Diabetes in his other; Esophageal cancer in his maternal uncle; Heart disease in his other; Hypertension in his other. There is no history of Rectal cancer, Stomach cancer, or Colon polyps.He reports that he has quit smoking. He has never used smokeless tobacco. He reports that he drinks alcohol. He reports that he does not use illicit drugs.  Current Outpatient Prescriptions on File Prior to Visit  Medication Sig Dispense Refill  . aspirin 81 MG chewable tablet Chew 81 mg by mouth daily.    Marland Kitchen glucose blood test strip Use as instructed bid 100 each 12  . Insulin Pen Needle 31G X 5 MM  MISC by Does not apply route 4 (four) times daily.    Kenneth Durham 100-3.6 UNIT-MG/ML SOPN INJECT 25 UNITS INJECT BELOW THE SKIN DAILY (NON FORMULARY)  5   No current facility-administered medications on file prior to visit.     Objective:  Objective Physical Exam  Constitutional: He is oriented to person, place, and time. Vital signs are normal. He appears well-developed and well-nourished. He is sleeping.  HENT:  Head: Normocephalic and atraumatic.  Mouth/Throat: Oropharynx is clear and moist.  Eyes: EOM are normal. Pupils are equal, round, and reactive to light.  Neck: Normal range of motion. Neck supple. No thyromegaly present.  Cardiovascular: Normal rate and regular rhythm.   No murmur heard. Pulmonary/Chest: Effort normal and breath sounds normal. No respiratory distress. He has no wheezes. He has no rales. He exhibits no tenderness.  Musculoskeletal: He exhibits no edema or tenderness.  Neurological: He is alert and oriented to person, place, and time.  Skin: Skin is warm and dry.  Psychiatric: He has a normal mood and affect. His behavior is normal. Judgment and thought content normal.  Nursing note and vitals reviewed.  BP 118/81 mmHg  Pulse 82  Temp(Src) 98.1 F (36.7 C) (Oral)  Ht 6\' 1"  (1.854 m)  Wt 196 lb 6.4 oz (89.086 kg)  BMI 25.92 kg/m2  SpO2 99% Wt Readings from Last 3 Encounters:  07/05/16 196 lb 6.4 oz (89.086 kg)  06/17/16 195 lb (88.451 kg)  04/05/16 215 lb 9.6 oz (97.796 kg)     Lab Results  Component Value Date   GLUCOSE 74 04/05/2016   CHOL 201* 04/05/2016   TRIG 77.0 04/05/2016   HDL 69.70 04/05/2016   LDLDIRECT 153.5 01/15/2011   LDLCALC 116* 04/05/2016   ALT 29 04/05/2016   AST 21 04/05/2016   NA 142 04/05/2016   K 4.1 04/05/2016   CL 103 04/05/2016   CREATININE 0.87 04/05/2016   BUN 14 04/05/2016   CO2 31 04/05/2016   TSH 1.51 06/17/2015   PSA 0.31 10/03/2015   HGBA1C 8.8* 04/05/2016   MICROALBUR 22.4* 04/05/2016    Dg Chest 2  View  06/18/2013  *RADIOLOGY REPORT* Clinical Data: Mass in the inferior aspect of the sternum. CHEST - 2 VIEW,LEFT RIBS - 2 VIEW,RIGHT RIBS - 2 VIEW Comparison: Chest x-ray 01/27/2009. Findings: Lung volumes are normal.  No consolidative airspace disease.  No pleural effusions.  No pneumothorax.  No pulmonary nodule or mass noted.  Pulmonary vasculature and the cardiomediastinal silhouette are within normal limits.  On the lateral projection the marker placed over the inferior aspect of the sternum is noted, but no definite underlying masses confidently identified on this plain film examination. Dedicated views of the left ribs demonstrate no acute displaced fractures, and no definite bony mass. Dedicated views of the right ribs demonstrate no definite displaced fracture and no definite bony mass. IMPRESSION: 1.  No soft tissue or bony mass is identified to correspond to the palpable abnormality in the inferior aspect of the sternum. 2.  No radiographic evidence of acute cardiopulmonary disease. Original Report Authenticated By: Vinnie Langton, M.D.   Dg Ribs Unilateral Left  06/18/2013  *RADIOLOGY REPORT* Clinical Data: Mass in the inferior aspect of the sternum. CHEST - 2 VIEW,LEFT RIBS - 2 VIEW,RIGHT RIBS - 2 VIEW Comparison: Chest x-ray 01/27/2009. Findings: Lung volumes are normal.  No consolidative airspace disease.  No pleural effusions.  No pneumothorax.  No pulmonary nodule or mass noted.  Pulmonary vasculature and the cardiomediastinal silhouette are within normal limits.  On the lateral projection the marker placed over the inferior aspect of the sternum is noted, but no definite underlying masses confidently identified on this plain film examination. Dedicated views of the left ribs demonstrate no acute displaced fractures, and no definite bony mass. Dedicated views of the right ribs demonstrate no definite displaced fracture and no definite bony mass. IMPRESSION: 1.  No soft tissue or bony mass is  identified to correspond to the palpable abnormality in the inferior aspect of the sternum. 2.  No radiographic evidence of acute cardiopulmonary disease. Original Report Authenticated By: Vinnie Langton, M.D.   Dg Ribs Unilateral Right  06/18/2013  *RADIOLOGY REPORT* Clinical Data: Mass in the inferior aspect of the sternum. CHEST - 2 VIEW,LEFT RIBS - 2 VIEW,RIGHT RIBS - 2 VIEW Comparison: Chest x-ray 01/27/2009. Findings: Lung volumes are normal.  No consolidative airspace disease.  No pleural effusions.  No pneumothorax.  No pulmonary nodule or mass noted.  Pulmonary vasculature and the cardiomediastinal silhouette are within normal limits.  On the lateral projection the marker placed over the inferior aspect of the sternum is noted, but no definite underlying masses confidently identified on this plain film examination. Dedicated views of the left ribs demonstrate no acute displaced fractures, and no definite bony mass. Dedicated views of the right ribs demonstrate no definite displaced fracture and no definite bony mass. IMPRESSION: 1.  No soft tissue or bony mass is identified to correspond to the palpable abnormality in the inferior aspect of the sternum. 2.  No radiographic evidence of acute cardiopulmonary disease. Original Report Authenticated By: Vinnie Langton, M.D.     Assessment & Plan:  Plan I have discontinued Mr. Venuto's BASAGLAR KWIKPEN, insulin aspart, and pravastatin. I am also having him maintain his glucose blood, aspirin, Insulin Pen Needle, XULTOPHY, DEPO-TESTOSTERONE, and sildenafil.  Meds ordered this encounter  Medications  . DEPO-TESTOSTERONE 100 MG/ML injection    Sig: Inject 1 mL into the muscle once a week.    Refill:  2  . sildenafil (REVATIO) 20 MG tablet    Sig: Take 5 tablets by mouth daily.    Refill:  0    Problem List Items Addressed This Visit      Unprioritized   Cerumen impaction    Debrox for about a week  rto for irrigation prn      DM  (diabetes mellitus) type II uncontrolled, periph vascular disorder ([place])    Per endo      Relevant Medications   sildenafil (REVATIO) 20 MG tablet   HYPERCHOLESTEROLEMIA    Check labs Pt never started pravastatin        Relevant Medications   sildenafil (REVATIO) 20 MG tablet   Neck pain    Improving on its own Advised pt to not sleep on the couch Warm compresses rto prn       Other Visit Diagnoses    elevated bp    -  Primary    Relevant Medications    sildenafil (REVATIO) 20 MG tablet    Other Relevant Orders    Lipid panel    CBC with Differential/Platelet    Comprehensive metabolic panel    Hyperlipidemia        Relevant Medications    sildenafil (REVATIO) 20 MG tablet    Other Relevant Orders    Lipid panel    Comprehensive metabolic panel       Follow-up: Return in about 6 months (around 01/05/2017), or if symptoms worsen or fail to improve, for annual exam, fasting.  Ann Held, DO

## 2016-07-05 NOTE — Assessment & Plan Note (Signed)
Debrox for about a week  rto for irrigation prn

## 2016-07-05 NOTE — Patient Instructions (Addendum)
Debrox --- for ear wax  Hypertension Hypertension, commonly called high blood pressure, is when the force of blood pumping through your arteries is too strong. Your arteries are the blood vessels that carry blood from your heart throughout your body. A blood pressure reading consists of a higher number over a lower number, such as 110/72. The higher number (systolic) is the pressure inside your arteries when your heart pumps. The lower number (diastolic) is the pressure inside your arteries when your heart relaxes. Ideally you want your blood pressure below 120/80. Hypertension forces your heart to work harder to pump blood. Your arteries may become narrow or stiff. Having untreated or uncontrolled hypertension can cause heart attack, stroke, kidney disease, and other problems. RISK FACTORS Some risk factors for high blood pressure are controllable. Others are not.  Risk factors you cannot control include:   Race. You may be at higher risk if you are African American.  Age. Risk increases with age.  Gender. Men are at higher risk than women before age 69 years. After age 57, women are at higher risk than men. Risk factors you can control include:  Not getting enough exercise or physical activity.  Being overweight.  Getting too much fat, sugar, calories, or salt in your diet.  Drinking too much alcohol. SIGNS AND SYMPTOMS Hypertension does not usually cause signs or symptoms. Extremely high blood pressure (hypertensive crisis) may cause headache, anxiety, shortness of breath, and nosebleed. DIAGNOSIS To check if you have hypertension, your health care provider will measure your blood pressure while you are seated, with your arm held at the level of your heart. It should be measured at least twice using the same arm. Certain conditions can cause a difference in blood pressure between your right and left arms. A blood pressure reading that is higher than normal on one occasion does not mean  that you need treatment. If it is not clear whether you have high blood pressure, you may be asked to return on a different day to have your blood pressure checked again. Or, you may be asked to monitor your blood pressure at home for 1 or more weeks. TREATMENT Treating high blood pressure includes making lifestyle changes and possibly taking medicine. Living a healthy lifestyle can help lower high blood pressure. You may need to change some of your habits. Lifestyle changes may include:  Following the DASH diet. This diet is high in fruits, vegetables, and whole grains. It is low in salt, red meat, and added sugars.  Keep your sodium intake below 2,300 mg per day.  Getting at least 30-45 minutes of aerobic exercise at least 4 times per week.  Losing weight if necessary.  Not smoking.  Limiting alcoholic beverages.  Learning ways to reduce stress. Your health care provider may prescribe medicine if lifestyle changes are not enough to get your blood pressure under control, and if one of the following is true:  You are 44-50 years of age and your systolic blood pressure is above 140.  You are 25 years of age or older, and your systolic blood pressure is above 150.  Your diastolic blood pressure is above 90.  You have diabetes, and your systolic blood pressure is over [id number] or your diastolic blood pressure is over 90.  You have kidney disease and your blood pressure is above 140/90.  You have heart disease and your blood pressure is above 140/90. Your personal target blood pressure may vary depending on your medical conditions, your age,  and other factors. HOME CARE INSTRUCTIONS  Have your blood pressure rechecked as directed by your health care provider.   Take medicines only as directed by your health care provider. Follow the directions carefully. Blood pressure medicines must be taken as prescribed. The medicine does not work as well when you skip doses. Skipping doses also puts  you at risk for problems.  Do not smoke.   Monitor your blood pressure at home as directed by your health care provider. SEEK MEDICAL CARE IF:   You think you are having a reaction to medicines taken.  You have recurrent headaches or feel dizzy.  You have swelling in your ankles.  You have trouble with your vision. SEEK IMMEDIATE MEDICAL CARE IF:  You develop a severe headache or confusion.  You have unusual weakness, numbness, or feel faint.  You have severe chest or abdominal pain.  You vomit repeatedly.  You have trouble breathing. MAKE SURE YOU:   Understand these instructions.  Will watch your condition.  Will get help right away if you are not doing well or get worse.   This information is not intended to replace advice given to you by your health care provider. Make sure you discuss any questions you have with your health care provider.   Document Released: 12/06/2005 Document Revised: 04/22/2015 Document Reviewed: 09/28/2013 Elsevier Interactive Patient Education [employer].

## 2016-07-05 NOTE — Assessment & Plan Note (Signed)
Improving on its own Advised pt to not sleep on the couch Warm compresses rto prn

## 2016-07-06 LAB — CBC WITH DIFFERENTIAL/PLATELET
BASOS PCT: 0.6 % (ref 0.0–3.0)
Basophils Absolute: 0 10*3/uL (ref 0.0–0.1)
EOS ABS: 0 10*3/uL (ref 0.0–0.7)
EOS PCT: 0.8 % (ref 0.0–5.0)
HEMATOCRIT: 39.1 % (ref 39.0–52.0)
HEMOGLOBIN: 13.2 g/dL (ref 13.0–17.0)
LYMPHS PCT: 46.2 % — AB (ref 12.0–46.0)
Lymphs Abs: 1.7 10*3/uL (ref 0.7–4.0)
MCHC: 33.7 g/dL (ref 30.0–36.0)
MCV: 89.6 fl (ref 78.0–100.0)
MONOS PCT: 6.8 % (ref 3.0–12.0)
Monocytes Absolute: 0.2 10*3/uL (ref 0.1–1.0)
Neutro Abs: 1.7 10*3/uL (ref 1.4–7.7)
Neutrophils Relative %: 45.6 % (ref 43.0–77.0)
Platelets: 293 10*3/uL (ref 150.0–400.0)
RBC: 4.37 Mil/uL (ref 4.22–5.81)
RDW: 12.9 % (ref 11.5–15.5)
WBC: 3.7 10*3/uL — AB (ref 4.0–10.5)

## 2016-07-08 LAB — HM DIABETES EYE EXAM

## 2016-07-13 ENCOUNTER — Other Ambulatory Visit: Payer: Self-pay

## 2016-07-13 MED ORDER — ATORVASTATIN CALCIUM 20 MG PO TABS: 20.0000 mg | ORAL_TABLET | Freq: Every day | ORAL | 2 refills | Status: DC

## 2016-07-14 ENCOUNTER — Encounter: Payer: Self-pay | Admitting: Family Medicine

## 2016-10-07 ENCOUNTER — Other Ambulatory Visit: Payer: Self-pay

## 2016-10-07 MED ORDER — ATORVASTATIN CALCIUM 20 MG PO TABS: 20.0000 mg | ORAL_TABLET | Freq: Every day | ORAL | 0 refills | Status: DC

## 2016-11-04 ENCOUNTER — Ambulatory Visit (INDEPENDENT_AMBULATORY_CARE_PROVIDER_SITE_OTHER): Payer: BLUE CROSS/BLUE SHIELD | Admitting: Medical

## 2016-11-04 VITALS — BP 118/78 | HR 92 | Temp 98.5°F | Wt 193.6 lb

## 2016-11-04 DIAGNOSIS — H6122 Impacted cerumen, left ear: Secondary | ICD-10-CM | POA: Diagnosis not present

## 2016-11-04 DIAGNOSIS — J3489 Other specified disorders of nose and nasal sinuses: Secondary | ICD-10-CM | POA: Diagnosis not present

## 2016-11-04 MED ORDER — FLUTICASONE PROPIONATE 50 MCG/ACT NA SUSP: 2.0000 | Freq: Every day | NASAL | 1 refills | Status: DC

## 2016-11-04 MED ORDER — AMOXICILLIN-POT CLAVULANATE 875-125 MG PO TABS: 1.0000 | ORAL_TABLET | Freq: Two times a day (BID) | ORAL | 0 refills | Status: DC

## 2016-11-04 NOTE — Progress Notes (Signed)
Pre visit review using our clinic review tool, if applicable. No additional management support is needed unless otherwise documented below in the visit note. 

## 2016-11-04 NOTE — Progress Notes (Signed)
Subjective:    Patient ID: Kenneth Durham, male    DOB: 1965/12/21, 50 y.o.   MRN: [id number]  HPI  Pt in reporting that last night he got some nasal congestion and maxillary sinus are hurting a lot. Pt has no upper teeth pain. No mucous production. No sneezing or cough. No fever, no chills or sweats. Recent just got back from [place]. Warm there but cold weather here.  Pt states last night he vomited one time. That was following sinus pressure/pain. In past with sinus pressure he reports some light sensitivity.(but not last night)  Pt does states over last 3 months about one time a month has gotten some intermittent sinus pressure that would last for about 24 hours then resolve. Prior times he took otc sudafed.   Pt never had migraine medication rx'd in the past.    Review of Systems  Constitutional: Negative for chills, fatigue and fever.  HENT: Positive for congestion, sinus pain and sinus pressure. Negative for ear pain, facial swelling, postnasal drip, rhinorrhea, sore throat, tinnitus and voice change.        Left side wax.  Respiratory: Negative for cough, chest tightness, shortness of breath and wheezing.   Cardiovascular: Negative for chest pain and palpitations.  Gastrointestinal: Positive for vomiting. Negative for abdominal pain and nausea.       Vomited one time yesterday when he described severe maxillary sinus pain.  No further vomiting episodes.Pt states pain sinus now much less.  Musculoskeletal: Negative for back pain.  Skin: Negative for rash.  Neurological: Negative for dizziness, seizures, syncope, facial asymmetry, speech difficulty, weakness, numbness and headaches.  Hematological: Negative for adenopathy. Does not bruise/bleed easily.  Psychiatric/Behavioral: Negative for behavioral problems and confusion.   Past Medical History:  Diagnosis Date  . Cataract    starting  . DIABETES MELLITUS, TYPE II 09/13/2007  . DISEASE, CHRONIC NONALCOHOLIC  LIVER NOS 0000000  . FATTY LIVER DISEASE 10/29/2008  . GOITER, NONTOXIC MULTINODULAR 09/22/2007  . Hyperlipidemia      Social History   Social History  . Marital status: Married    Spouse name: N/A  . Number of children: N/A  . Years of education: N/A   Occupational History  . 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 Smoking status: Former Research scientist (life sciences)  . Smokeless tobacco: Never Used  . Alcohol use 0.0 oz/week     Comment: rare   . Drug use: No  . Sexual activity: Not on file   Other Topics Concern  . Not on file   Social History Narrative  . No narrative on file    Past Surgical History:  Procedure Laterality Date  . WISDOM TOOTH EXTRACTION      Family History  Problem Relation Age of Onset  . Diabetes Other     1st degree relative  . Hypertension Other   . Heart disease Other     FH of Cardiac Murmur  . Cancer Other     Colon Cancer- cousin   . Esophageal cancer Maternal Uncle   . Rectal cancer Neg Hx   . Stomach cancer Neg Hx   . Colon polyps Neg Hx     Allergies  Allergen Reactions  . Shrimp [Shellfish Allergy] Hives and Swelling    Current Outpatient Prescriptions on File Prior to Visit  Medication Sig Dispense Refill  . aspirin 81 MG chewable tablet Chew 81 mg by mouth daily.    Marland Kitchen  atorvastatin (LIPITOR) 20 MG tablet Take 1 tablet (20 mg total) by mouth daily. 90 tablet 0  . DEPO-TESTOSTERONE 100 MG/ML injection Inject 1 mL into the muscle once a week.  2  . glucose blood test strip Use as instructed bid 100 each 12  . Insulin Pen Needle 31G X 5 MM MISC by Does not apply route 4 (four) times daily.    . sildenafil (REVATIO) 20 MG tablet Take 5 tablets by mouth daily.  0  . XULTOPHY 100-3.6 UNIT-MG/ML SOPN INJECT 25 UNITS INJECT BELOW THE SKIN DAILY (NON FORMULARY)  5   No current facility-administered medications on file prior to visit.     BP 118/78 (BP Location: Left Arm, Patient Position: Sitting, Cuff  Size: Normal)   Pulse 92   Temp 98.5 F (36.9 C) (Oral)   Wt 193 lb 9.6 oz (87.8 kg)   SpO2 98%   BMI 25.54 kg/m       Objective:   Physical Exam   General  Mental Status - Alert. General Appearance - Well groomed. Not in acute distress.  Skin Rashes- No Rashes.  HEENT Head- Normal. Ear Auditory Canal - Left- severe wax obsruction(can't see tm) Post lavage wax cleared completely Right - Normal.Tympanic Membrane- Left- Normal. Right- Normal. Eye Sclera/Conjunctiva- Left- Normal. Right- Normal. Nose & Sinuses Nasal Mucosa- Left-  Boggy and Congested. Right-  Boggy and  Congested.Bilateral maxillary and ethmoid sinus pressure but no  frontal sinus pressure. Mouth & Throat Lips: Upper Lip- Normal: no dryness, cracking, pallor, cyanosis, or vesicular eruption. Lower Lip-Normal: no dryness, cracking, pallor, cyanosis or vesicular eruption. Buccal Mucosa- Bilateral- No Aphthous ulcers. Oropharynx- No Discharge or Erythema. +pnd Tonsils: Characteristics- Bilateral- No Erythema or Congestion. Size/Enlargement- Bilateral- No enlargement. Discharge- bilateral-None.  Neck Neck- Supple. No Masses.  Chest and Lung Exam Auscultation: Breath Sounds:-Clear even and unlabored.  Cardiovascular Auscultation:Rythm- Regular, rate and rhythm. Murmurs & Other Heart Sounds:Ausculatation of the heart reveal- No Murmurs.  Lymphatic Head & Neck General Head & Neck Lymphatics: Bilateral: Description- No Localized lymphadenopathy.  Neurologic Cranial Nerve exam:- CN III-XII intact(No nystagmus), symmetric smile. Strength:- 5/5 equal and symmetric strength both upper and lower extremities.    Assessment & Plan:  For your recent sinus pressure that has been recurrent for 3-4 months will rx flonase and augmentin antibiotic.   We need to follow your symptoms closely. If any severe ha, vomiting episodes, neurologic signs or symptoms as discussed then let us know. Since in that event you may  need imaging studies. If severe symptoms after hours then ED evaluation.   I do want you to follow up in 2 weeks to see how you feel and decide if we need to go ahead and do imaging studies. Follow up as needed as well.  Pt did want to be referred to ENT. So MA states she will put in. I did stress to patient still want him to follow up with me in 2 weeks.  Sheilia Reznick, Percell Miller, PA-C

## 2016-11-04 NOTE — Patient Instructions (Addendum)
For your recent sinus pressure that has been recurrent for 3-4 months will rx flonase and augmentin antibiotic.   We need to follow your symptoms closely. If any severe ha, vomiting episodes, neurologic signs or symptoms as discussed then let us know. Since in that event you may need imaging studies. If severe symptoms after hours then ED evaluation.   I do want you to follow up in 2 weeks to see how you feel and decide if we need to go ahead and do imaging studies. Follow up as needed as well.

## 2016-11-20 ENCOUNTER — Other Ambulatory Visit: Payer: Self-pay | Admitting: Family Medicine

## 2016-11-22 ENCOUNTER — Encounter: Payer: Self-pay | Admitting: Medical

## 2016-11-22 ENCOUNTER — Ambulatory Visit (INDEPENDENT_AMBULATORY_CARE_PROVIDER_SITE_OTHER): Payer: BLUE CROSS/BLUE SHIELD | Admitting: Medical

## 2016-11-22 ENCOUNTER — Telehealth: Payer: Self-pay | Admitting: Medical

## 2016-11-22 VITALS — BP 116/76 | HR 66 | Temp 98.0°F | Ht 73.0 in | Wt 193.4 lb

## 2016-11-22 DIAGNOSIS — J3089 Other allergic rhinitis: Secondary | ICD-10-CM | POA: Insufficient documentation

## 2016-11-22 DIAGNOSIS — J3489 Other specified disorders of nose and nasal sinuses: Secondary | ICD-10-CM

## 2016-11-22 MED ORDER — AZELASTINE HCL 0.1 % NA SOLN: 2.0000 | Freq: Two times a day (BID) | NASAL | 1 refills | Status: DC

## 2016-11-22 NOTE — Telephone Encounter (Signed)
Pt seen today. Opened up chart second time inadvertant.

## 2016-11-22 NOTE — Progress Notes (Signed)
Subjective:    Patient ID: Kenneth Durham, male    DOB: 1966/11/11, 50 y.o.   MRN: [id number]  HPI  Pt in for evaluation. He points to rt side maxillary sinus area(but at times over all sinuses). He is going to see specialist today. He did not try the meds(flonase and augmentin) that I gave him on the last visit. But he still came in today to see me. Pt states symptoms are more prominent on cold days.   Pt did vomit one time related to sinus pressure(in the past) but no other episodes like that. No other episodes that he felt light was bothering him/sensitive to light.  No neurologic type signs or symptoms reported today.    Review of Systems  Constitutional: Negative for chills, fatigue and fever.  HENT: Positive for congestion and sinus pressure. Negative for ear pain, facial swelling, postnasal drip, sneezing and trouble swallowing.   Eyes: Negative for photophobia.  Respiratory: Negative for cough, chest tightness, shortness of breath and wheezing.   Cardiovascular: Negative for chest pain and palpitations.  Gastrointestinal: Negative for abdominal pain.  Musculoskeletal: Negative for back pain, gait problem, myalgias and neck stiffness.  Skin: Negative for rash.  Neurological: Negative for dizziness, seizures, syncope, light-headedness and headaches.  Hematological: Negative for adenopathy. Does not bruise/bleed easily.  Psychiatric/Behavioral: Negative for agitation and confusion.    Past Medical History:  Diagnosis Date  . Cataract    starting  . DIABETES MELLITUS, TYPE II 09/13/2007  . DISEASE, CHRONIC NONALCOHOLIC LIVER NOS 0000000  . FATTY LIVER DISEASE 10/29/2008  . GOITER, NONTOXIC MULTINODULAR 09/22/2007  . Hyperlipidemia      Social History   Social History  . Marital status: Married    Spouse name: N/A  . Number of children: N/A  . Years of education: N/A   Occupational History  . 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 Smoking status: Former Research scientist (life sciences)  . Smokeless tobacco: Never Used  . Alcohol use 0.0 oz/week     Comment: rare   . Drug use: No  . Sexual activity: Not on file   Other Topics Concern  . Not on file   Social History Narrative  . No narrative on file    Past Surgical History:  Procedure Laterality Date  . WISDOM TOOTH EXTRACTION      Family History  Problem Relation Age of Onset  . Diabetes Other     1st degree relative  . Hypertension Other   . Heart disease Other     FH of Cardiac Murmur  . Cancer Other     Colon Cancer- cousin   . Esophageal cancer Maternal Uncle   . Rectal cancer Neg Hx   . Stomach cancer Neg Hx   . Colon polyps Neg Hx     Allergies  Allergen Reactions  . Shrimp [Shellfish Allergy] Hives and Swelling    Current Outpatient Prescriptions on File Prior to Visit  Medication Sig Dispense Refill  . aspirin 81 MG chewable tablet Chew 81 mg by mouth daily.    Marland Kitchen atorvastatin (LIPITOR) 20 MG tablet TAKE 1 TABLET BY MOUTH DAILY. 30 tablet 0  . DEPO-TESTOSTERONE 100 MG/ML injection Inject 1 mL into the muscle once a week.  2  . fluticasone (FLONASE) 50 MCG/ACT nasal spray Place 2 sprays into both nostrils daily. 16 g 1  . glucose blood test strip Use as instructed bid 100 each  12  . Insulin Pen Needle 31G X 5 MM MISC by Does not apply route 4 (four) times daily.    . sildenafil (REVATIO) 20 MG tablet Take 5 tablets by mouth daily.  0  . XULTOPHY 100-3.6 UNIT-MG/ML SOPN INJECT 25 UNITS INJECT BELOW THE SKIN DAILY (NON FORMULARY)  5   No current facility-administered medications on file prior to visit.     BP 116/76 (BP Location: Left Arm, Patient Position: Sitting, Cuff Size: Normal)   Pulse 66   Temp 98 F (36.7 C) (Oral)   Ht 6\' 1"  (1.854 m)   Wt 193 lb 6.4 oz (87.7 kg)   SpO2 98%   BMI 25.52 kg/m       Objective:   Physical Exam   General  Mental Status - Alert. General Appearance - Well groomed. Not in acute  distress.  Skin Rashes- No Rashes.  HEENT Head- Normal. Ear Auditory Canal - Left- Normal. Right - Normal.Tympanic Membrane- Left- Normal. Right- Normal. Eye Sclera/Conjunctiva- Left- Normal. Right- Normal. Nose & Sinuses Nasal Mucosa- Left-  Boggy and Congested. Right-  Boggy and  Congested.Bilateral no  maxillary and  no  frontal sinus pressure. Mouth & Throat Lips: Upper Lip- Normal: no dryness, cracking, pallor, cyanosis, or vesicular eruption. Lower Lip-Normal: no dryness, cracking, pallor, cyanosis or vesicular eruption. Buccal Mucosa- Bilateral- No Aphthous ulcers. Oropharynx- No Discharge or Erythema. Tonsils: Characteristics- Bilateral- No Erythema or Congestion. Size/Enlargement- Bilateral- No enlargement. Discharge- bilateral-None.  Neck Neck- Supple. No Masses.   Chest and Lung Exam Auscultation: Breath Sounds:-Clear even and unlabored.  Cardiovascular Auscultation:Rythm- Regular, rate and rhythm. Murmurs & Other Heart Sounds:Ausculatation of the heart reveal- No Murmurs.  Lymphatic Head & Neck General Head & Neck Lymphatics: Bilateral: Description- No Localized lymphadenopathy.  CN- III-XII grossly intact.      Assessment & Plan:  Please keep your appointment with ENT today. For your intermittent sinus pressure for months  you may benefit from antibiotic, flonase and/or astelin nasal spray. You have not started treatment yet so we will get ENT opinion today and see what treatment they advise.   Follow up with your pcp for physical as scheduled or with me as needed  Note pt did not take meds that I gave him and he requested referral to ENT. Since office visit today with ENT, I did offer him CPE today. In order to make his appointment here most beneficial. Pt declined CPE today stating he will do with pcp.

## 2016-11-22 NOTE — Progress Notes (Signed)
Pre visit review using our clinic review tool, if applicable. No additional management support is needed unless otherwise documented below in the visit note. 

## 2016-11-22 NOTE — Patient Instructions (Addendum)
Please keep your appointment with ENT today. For your intermittent sinus pressure for months  you may benefit from antibiotic, flonase and/or astelin nasal spray. You have not started treatment yet so we will get ENT opinion today and see what treatment they advise.   Follow up with your pcp for physical as scheduled or with me as needed.  Note you may have sinus pressure/infection following allergies/vasomotor rhinitis.

## 2016-12-02 ENCOUNTER — Encounter: Payer: Self-pay | Admitting: Podiatry

## 2016-12-02 ENCOUNTER — Ambulatory Visit (INDEPENDENT_AMBULATORY_CARE_PROVIDER_SITE_OTHER): Payer: BLUE CROSS/BLUE SHIELD | Admitting: Podiatry

## 2016-12-02 ENCOUNTER — Other Ambulatory Visit: Payer: Self-pay | Admitting: *Deleted

## 2016-12-02 VITALS — BP 121/93 | HR 77 | Resp 16

## 2016-12-02 DIAGNOSIS — E119 Type 2 diabetes mellitus without complications: Secondary | ICD-10-CM

## 2016-12-02 DIAGNOSIS — M79676 Pain in unspecified toe(s): Secondary | ICD-10-CM

## 2016-12-02 DIAGNOSIS — B351 Tinea unguium: Secondary | ICD-10-CM

## 2016-12-02 MED ORDER — NONFORMULARY OR COMPOUNDED ITEM: 2 refills | Status: DC

## 2016-12-02 NOTE — Patient Instructions (Signed)

## 2016-12-02 NOTE — Addendum Note (Signed)
Addended by: Harriett Sine D on: 12/02/2016 03:31 PM   Modules accepted: Orders

## 2016-12-02 NOTE — Progress Notes (Signed)
   Subjective:    Patient ID: Kenneth Durham, male    DOB: [birth year], 50 y.o.   MRN: [id number]  HPI 50 year old male presents the office today for diabetic foot evaluation and for painful, thick, elongated toenails the cannot trim himself. Denies any redness or drainage from the nail sites. Denies any claudication symptoms. No numbness or tingling. Last A1c was 10.   He states he is not taking his medications as directed.   Review of Systems  HENT: Positive for sinus pressure and tinnitus.   All other systems reviewed and are negative.      Objective:   Physical Exam General: AAO x3, NAD  Dermatological: Nails are hypertrophic, dystrophic, brittle, discolored, elongated 10. No surrounding redness or drainage. Tenderness nails 1-5 bilaterally. No open lesions or pre-ulcerative lesions are identified today.  Vascular: Dorsalis Pedis artery and Posterior Tibial artery pedal pulses are 2/4 bilateral with immedate capillary fill time. Pedal hair growth present. There is no pain with calf compression, swelling, warmth, erythema.   Neruologic: Grossly intact via light touch bilateral. Vibratory intact via tuning fork bilateral. Protective threshold with Semmes Wienstein monofilament intact to all pedal sites bilateral.  Musculoskeletal: No gross boney pedal deformities bilateral. No pain, crepitus, or limitation noted with foot and ankle range of motion bilateral. Muscular strength 5/5 in all groups tested bilateral.  Gait: Unassisted, Nonantalgic.      Assessment & Plan:  50 year old male with symptomatic onychomycosis -Treatment options discussed including all alternatives, risks, and complications -Etiology of symptoms were discussed -Nails debrided 10 without complications or bleeding. He would like to proceed with mediation for fungus. He does not want an oral medication. Prescribed compound ointment through [employer].  -Daily foot inspection -Follow-up in 3 months or sooner  if any problems arise. In the meantime, encouraged to call the office with any questions, concerns, change in symptoms.   Celesta Gentile, DPM

## 2017-01-03 ENCOUNTER — Encounter: Payer: Self-pay | Admitting: Family Medicine

## 2017-01-03 ENCOUNTER — Ambulatory Visit (INDEPENDENT_AMBULATORY_CARE_PROVIDER_SITE_OTHER): Payer: BLUE CROSS/BLUE SHIELD | Admitting: Family Medicine

## 2017-01-03 VITALS — BP 124/70 | HR 83 | Temp 98.6°F | Ht 73.0 in | Wt 188.0 lb

## 2017-01-03 DIAGNOSIS — IMO0002 Reserved for concepts with insufficient information to code with codable children: Secondary | ICD-10-CM

## 2017-01-03 DIAGNOSIS — E7800 Pure hypercholesterolemia, unspecified: Secondary | ICD-10-CM

## 2017-01-03 DIAGNOSIS — Z0000 Encounter for general adult medical examination without abnormal findings: Secondary | ICD-10-CM

## 2017-01-03 DIAGNOSIS — E785 Hyperlipidemia, unspecified: Secondary | ICD-10-CM | POA: Diagnosis not present

## 2017-01-03 DIAGNOSIS — I10 Essential (primary) hypertension: Secondary | ICD-10-CM | POA: Diagnosis not present

## 2017-01-03 DIAGNOSIS — E1165 Type 2 diabetes mellitus with hyperglycemia: Secondary | ICD-10-CM

## 2017-01-03 DIAGNOSIS — E0590 Thyrotoxicosis, unspecified without thyrotoxic crisis or storm: Secondary | ICD-10-CM

## 2017-01-03 DIAGNOSIS — E1151 Type 2 diabetes mellitus with diabetic peripheral angiopathy without gangrene: Secondary | ICD-10-CM

## 2017-01-03 DIAGNOSIS — E118 Type 2 diabetes mellitus with unspecified complications: Secondary | ICD-10-CM

## 2017-01-03 LAB — COMPREHENSIVE METABOLIC PANEL
ALBUMIN: 4 g/dL (ref 3.5–5.2)
ALT: 20 U/L (ref 0–53)
AST: 19 U/L (ref 0–37)
Alkaline Phosphatase: 102 U/L (ref 39–117)
BUN: 5 mg/dL — ABNORMAL LOW (ref 6–23)
CALCIUM: 8.9 mg/dL (ref 8.4–10.5)
CO2: 32 mEq/L (ref 19–32)
Chloride: 98 mEq/L (ref 96–112)
Creatinine, Ser: 0.91 mg/dL (ref 0.40–1.50)
GFR: 112.74 mL/min (ref >60.00)
Glucose, Bld: 239 mg/dL — ABNORMAL HIGH (ref 70–99)
POTASSIUM: 4.1 meq/L (ref 3.5–5.1)
Sodium: 135 mEq/L (ref 135–145)
TOTAL PROTEIN: 6.9 g/dL (ref 6.0–8.3)
Total Bilirubin: 0.4 mg/dL (ref 0.2–1.2)

## 2017-01-03 LAB — LIPID PANEL
Cholesterol: 188 mg/dL (ref 0–200)
HDL: 64.3 mg/dL (ref >39.00)
LDL CALC: 112 mg/dL — AB (ref 0–99)
NONHDL: 123.26
Total CHOL/HDL Ratio: 3
Triglycerides: 55 mg/dL (ref 0.0–149.0)
VLDL: 11 mg/dL (ref 0.0–40.0)

## 2017-01-03 LAB — POCT URINALYSIS DIPSTICK
Bilirubin, UA: NEGATIVE
Glucose, UA: 1000
KETONES UA: NEGATIVE
Leukocytes, UA: NEGATIVE
Nitrite, UA: NEGATIVE
PH UA: 6
RBC UA: NEGATIVE
Spec Grav, UA: 1.025
Urobilinogen, UA: 0.2

## 2017-01-03 LAB — TSH: TSH: 0.44 u[IU]/mL (ref 0.35–4.50)

## 2017-01-03 LAB — PSA: PSA: 0.4 ng/mL (ref 0.10–4.00)

## 2017-01-03 NOTE — Assessment & Plan Note (Signed)
Per endo °

## 2017-01-03 NOTE — Progress Notes (Signed)
Patient ID: Kenneth Durham, male    DOB: Feb 01, 1966  Age: 51 y.o. MRN: [id number]    Subjective:  Subjective  HPI Kenneth Durham presents for cpe. He is being treated for cold    No other complaints  Review of Systems  Constitutional: Negative for activity change, appetite change, chills, diaphoresis, fatigue, fever and unexpected weight change.  HENT: Negative for congestion, ear pain, hearing loss, nosebleeds, postnasal drip, rhinorrhea, sinus pressure, sneezing and tinnitus.   Eyes: Negative for photophobia, discharge, itching and visual disturbance.  Respiratory: Negative.  Negative for cough and shortness of breath.   Cardiovascular: Negative.  Negative for chest pain, palpitations and leg swelling.  Gastrointestinal: Negative for abdominal distention, abdominal pain, anal bleeding, blood in stool, constipation, diarrhea, nausea and vomiting.  Endocrine: Negative.   Genitourinary: Negative.  Negative for dysuria, frequency, hematuria and urgency.  Musculoskeletal: Negative.  Negative for back pain and myalgias.  Skin: Negative.  Negative for rash.  Allergic/Immunologic: Negative.  Negative for environmental allergies.  Neurological: Negative for dizziness, weakness, light-headedness, numbness and headaches.  Hematological: Does not bruise/bleed easily.  Psychiatric/Behavioral: Negative for agitation, confusion, decreased concentration, dysphoric mood, sleep disturbance and suicidal ideas. The patient is not nervous/anxious.     History Past Medical History:  Diagnosis Date  . Cataract    starting  . DIABETES MELLITUS, TYPE II 09/13/2007  . DISEASE, CHRONIC NONALCOHOLIC LIVER NOS 0000000  . FATTY LIVER DISEASE 10/29/2008  . GOITER, NONTOXIC MULTINODULAR 09/22/2007  . Hyperlipidemia   . Preventative health care 10/03/2015    He has a past surgical history that includes Wisdom tooth extraction.   His family history includes Cancer in his other; Diabetes in his other;  Esophageal cancer in his maternal uncle; Heart disease in his other; Hypertension in his other.He reports that he has quit smoking. He has never used smokeless tobacco. He reports that he drinks alcohol. He reports that he does not use drugs.  Current Outpatient Prescriptions on File Prior to Visit  Medication Sig Dispense Refill  . aspirin 81 MG chewable tablet Chew 81 mg by mouth daily.    Marland Kitchen atorvastatin (LIPITOR) 20 MG tablet TAKE 1 TABLET BY MOUTH DAILY. 30 tablet 0  . azelastine (ASTELIN) 0.1 % nasal spray Place 2 sprays into both nostrils 2 (two) times daily. Use in each nostril as directed 30 mL 1  . DEPO-TESTOSTERONE 100 MG/ML injection Inject 1 mL into the muscle once a week.  2  . Fluocinonide 0.1 % CREA     . fluticasone (FLONASE) 50 MCG/ACT nasal spray Place 2 sprays into both nostrils daily. 16 g 1  . glucose blood test strip Use as instructed bid 100 each 12  . Insulin Pen Needle 31G X 5 MM MISC by Does not apply route 4 (four) times daily.    . NONFORMULARY OR COMPOUNDED ITEM [facility]:  Onychomycosis Nail Lacquer - Fluconazole 2%, Terbinafine 1%, DMSO apply daily to affected area. 120 each 2  . sildenafil (REVATIO) 20 MG tablet Take 5 tablets by mouth daily.  0  . XULTOPHY 100-3.6 UNIT-MG/ML SOPN INJECT 25 UNITS INJECT BELOW THE SKIN DAILY (NON FORMULARY)  5   No current facility-administered medications on file prior to visit.      Objective:  Objective  Physical Exam  Constitutional: He is oriented to person, place, and time. He appears well-developed and well-nourished. No distress.  HENT:  Head: Normocephalic and atraumatic.  Right Ear: External ear normal.  Left Ear: External  ear normal.  Nose: Nose normal.  Mouth/Throat: Oropharynx is clear and moist. No oropharyngeal exudate.  Eyes: Conjunctivae and EOM are normal. Pupils are equal, round, and reactive to light. Right eye exhibits no discharge. Left eye exhibits no discharge.  Neck: Normal range of motion.  Neck supple. No JVD present. No thyromegaly present.  Cardiovascular: Normal rate, regular rhythm and intact distal pulses.   No murmur heard. Pulmonary/Chest: Effort normal and breath sounds normal. No respiratory distress. He has no wheezes. He has no rales. He exhibits no tenderness.  Abdominal: Soft. Bowel sounds are normal. He exhibits no distension and no mass. There is no tenderness. There is no rebound and no guarding.  Genitourinary:  Genitourinary Comments: Deferred ---  urology  Musculoskeletal: Normal range of motion. He exhibits no edema or tenderness.  Lymphadenopathy:    He has no cervical adenopathy.  Neurological: He is alert and oriented to person, place, and time. He displays normal reflexes. No cranial nerve deficit. He exhibits normal muscle tone.  Skin: Skin is warm and dry. No rash noted. He is not diaphoretic. No erythema.  Psychiatric: He has a normal mood and affect. His behavior is normal. Judgment and thought content normal.  Nursing note and vitals reviewed.  BP 124/70 (BP Location: Left Arm, Patient Position: Sitting, Cuff Size: Large)   Pulse 83   Temp 98.6 F (37 C) (Oral)   Ht 6\' 1"  (1.854 m)   Wt 188 lb (85.3 kg)   SpO2 98% Comment: RA  BMI 24.80 kg/m  Wt Readings from Last 3 Encounters:  01/03/17 188 lb (85.3 kg)  11/22/16 193 lb 6.4 oz (87.7 kg)  11/04/16 193 lb 9.6 oz (87.8 kg)     Lab Results  Component Value Date   WBC 3.7 (L) 07/05/2016   HGB 13.2 07/05/2016   HCT 39.1 07/05/2016   PLT 293.0 07/05/2016   GLUCOSE 239 (H) 01/03/2017   CHOL 188 01/03/2017   TRIG 55.0 01/03/2017   HDL 64.30 01/03/2017   LDLDIRECT 153.5 01/15/2011   LDLCALC 112 (H) 01/03/2017   ALT 20 01/03/2017   AST 19 01/03/2017   NA 135 01/03/2017   K 4.1 01/03/2017   CL 98 01/03/2017   CREATININE 0.91 01/03/2017   BUN 5 (L) 01/03/2017   CO2 32 01/03/2017   TSH 0.44 01/03/2017   PSA 0.40 01/03/2017   HGBA1C 8.8 (H) 04/05/2016   MICROALBUR 22.4 (H) 04/05/2016     Dg Chest 2 View  Result Date: 06/18/2013 *RADIOLOGY REPORT* Clinical Data: Mass in the inferior aspect of the sternum. CHEST - 2 VIEW,LEFT RIBS - 2 VIEW,RIGHT RIBS - 2 VIEW Comparison: Chest x-ray 01/27/2009. Findings: Lung volumes are normal.  No consolidative airspace disease.  No pleural effusions.  No pneumothorax.  No pulmonary nodule or mass noted.  Pulmonary vasculature and the cardiomediastinal silhouette are within normal limits.  On the lateral projection the marker placed over the inferior aspect of the sternum is noted, but no definite underlying masses confidently identified on this plain film examination. Dedicated views of the left ribs demonstrate no acute displaced fractures, and no definite bony mass. Dedicated views of the right ribs demonstrate no definite displaced fracture and no definite bony mass. IMPRESSION: 1.  No soft tissue or bony mass is identified to correspond to the palpable abnormality in the inferior aspect of the sternum. 2.  No radiographic evidence of acute cardiopulmonary disease. Original Report Authenticated By: Vinnie Langton, M.D.   Dg Ribs Unilateral Left  Result Date: 06/18/2013 *RADIOLOGY REPORT* Clinical Data: Mass in the inferior aspect of the sternum. CHEST - 2 VIEW,LEFT RIBS - 2 VIEW,RIGHT RIBS - 2 VIEW Comparison: Chest x-ray 01/27/2009. Findings: Lung volumes are normal.  No consolidative airspace disease.  No pleural effusions.  No pneumothorax.  No pulmonary nodule or mass noted.  Pulmonary vasculature and the cardiomediastinal silhouette are within normal limits.  On the lateral projection the marker placed over the inferior aspect of the sternum is noted, but no definite underlying masses confidently identified on this plain film examination. Dedicated views of the left ribs demonstrate no acute displaced fractures, and no definite bony mass. Dedicated views of the right ribs demonstrate no definite displaced fracture and no definite bony mass.  IMPRESSION: 1.  No soft tissue or bony mass is identified to correspond to the palpable abnormality in the inferior aspect of the sternum. 2.  No radiographic evidence of acute cardiopulmonary disease. Original Report Authenticated By: Vinnie Langton, M.D.   Dg Ribs Unilateral Right  Result Date: 06/18/2013 *RADIOLOGY REPORT* Clinical Data: Mass in the inferior aspect of the sternum. CHEST - 2 VIEW,LEFT RIBS - 2 VIEW,RIGHT RIBS - 2 VIEW Comparison: Chest x-ray 01/27/2009. Findings: Lung volumes are normal.  No consolidative airspace disease.  No pleural effusions.  No pneumothorax.  No pulmonary nodule or mass noted.  Pulmonary vasculature and the cardiomediastinal silhouette are within normal limits.  On the lateral projection the marker placed over the inferior aspect of the sternum is noted, but no definite underlying masses confidently identified on this plain film examination. Dedicated views of the left ribs demonstrate no acute displaced fractures, and no definite bony mass. Dedicated views of the right ribs demonstrate no definite displaced fracture and no definite bony mass. IMPRESSION: 1.  No soft tissue or bony mass is identified to correspond to the palpable abnormality in the inferior aspect of the sternum. 2.  No radiographic evidence of acute cardiopulmonary disease. Original Report Authenticated By: Vinnie Langton, M.D.     Assessment & Plan:  Plan  I am having Mr. Reindl maintain his glucose blood, aspirin, Insulin Pen Needle, XULTOPHY, DEPO-TESTOSTERONE, sildenafil, fluticasone, atorvastatin, azelastine, Fluocinonide, and NONFORMULARY OR COMPOUNDED ITEM.  No orders of the defined types were placed in this encounter.   Problem List Items Addressed This Visit      Unprioritized   DM (diabetes mellitus) type II uncontrolled, periph vascular disorder ([place])    Per endo      HYPERCHOLESTEROLEMIA    Check labs  Con't meds--lipitor      Hyperthyroidism    Per endo       RESOLVED: Preventative health care - Primary   Relevant Orders   Lipid panel (Completed)   POCT urinalysis dipstick (Completed)   TSH (Completed)   PSA (Completed)   Comprehensive metabolic panel (Completed)    Other Visit Diagnoses    Uncontrolled type 2 diabetes mellitus with complication, without long-term current use of insulin ([place])       Relevant Orders   POCT urinalysis dipstick (Completed)   Comprehensive metabolic panel (Completed)   Hyperlipidemia, unspecified hyperlipidemia type       Relevant Orders   Lipid panel (Completed)   POCT urinalysis dipstick (Completed)   Comprehensive metabolic panel (Completed)   Essential hypertension       Relevant Orders   POCT urinalysis dipstick (Completed)   Comprehensive metabolic panel (Completed)      Follow-up: Return in about 6 months (around 07/03/2017) for hypertension, hyperlipidemia.  Ann Held, DO

## 2017-01-03 NOTE — Progress Notes (Signed)
Pre visit review using our clinic review tool, if applicable. No additional management support is needed unless otherwise documented below in the visit note. 

## 2017-01-03 NOTE — Assessment & Plan Note (Signed)
Check labs  Con't meds--lipitor

## 2017-01-03 NOTE — Patient Instructions (Signed)
Preventive Care 40-64 Years, Male Preventive care refers to lifestyle choices and visits with your health care provider that can promote health and wellness. What does preventive care include?  A yearly physical exam. This is also called an annual well check.  Dental exams once or twice a year.  Routine eye exams. Ask your health care provider how often you should have your eyes checked.  Personal lifestyle choices, including:  Daily care of your teeth and gums.  Regular physical activity.  Eating a healthy diet.  Avoiding tobacco and drug use.  Limiting alcohol use.  Practicing safe sex.  Taking low-dose aspirin every day starting at age 50. What happens during an annual well check? The services and screenings done by your health care provider during your annual well check will depend on your age, overall health, lifestyle risk factors, and family history of disease. Counseling  Your health care provider may ask you questions about your:  Alcohol use.  Tobacco use.  Drug use.  Emotional well-being.  Home and relationship well-being.  Sexual activity.  Eating habits.  Work and work environment. Screening  You may have the following tests or measurements:  Height, weight, and BMI.  Blood pressure.  Lipid and cholesterol levels. These may be checked every 5 years, or more frequently if you are over 50 years old.  Skin check.  Lung cancer screening. You may have this screening every year starting at age 55 if you have a 30-pack-year history of smoking and currently smoke or have quit within the past 15 years.  Fecal occult blood test (FOBT) of the stool. You may have this test every year starting at age 50.  Flexible sigmoidoscopy or colonoscopy. You may have a sigmoidoscopy every 5 years or a colonoscopy every 10 years starting at age 50.  Prostate cancer screening. Recommendations will vary depending on your family history and other risks.  Hepatitis C  blood test.  Hepatitis B blood test.  Sexually transmitted disease (STD) testing.  Diabetes screening. This is done by checking your blood sugar (glucose) after you have not eaten for a while (fasting). You may have this done every 1-3 years. Discuss your test results, treatment options, and if necessary, the need for more tests with your health care provider. Vaccines  Your health care provider may recommend certain vaccines, such as:  Influenza vaccine. This is recommended every year.  Tetanus, diphtheria, and acellular pertussis (Tdap, Td) vaccine. You may need a Td booster every 10 years.  Varicella vaccine. You may need this if you have not been vaccinated.  Zoster vaccine. You may need this after age 60.  Measles, mumps, and rubella (MMR) vaccine. You may need at least one dose of MMR if you were born in 1957 or later. You may also need a second dose.  Pneumococcal 13-valent conjugate (PCV13) vaccine. You may need this if you have certain conditions and have not been vaccinated.  Pneumococcal polysaccharide (PPSV23) vaccine. You may need one or two doses if you smoke cigarettes or if you have certain conditions.  Meningococcal vaccine. You may need this if you have certain conditions.  Hepatitis A vaccine. You may need this if you have certain conditions or if you travel or work in places where you may be exposed to hepatitis A.  Hepatitis B vaccine. You may need this if you have certain conditions or if you travel or work in places where you may be exposed to hepatitis B.  Haemophilus influenzae type b (Hib)   vaccine. You may need this if you have certain risk factors. Talk to your health care provider about which screenings and vaccines you need and how often you need them. This information is not intended to replace advice given to you by your health care provider. Make sure you discuss any questions you have with your health care provider. Document Released: 01/02/2016  Document Revised: 08/25/2016 Document Reviewed: 10/07/2015 Elsevier Interactive Patient Education  2017 [employer].

## 2017-01-07 ENCOUNTER — Other Ambulatory Visit: Payer: Self-pay

## 2017-01-07 MED ORDER — ATORVASTATIN CALCIUM 40 MG PO TABS: 40.0000 mg | ORAL_TABLET | Freq: Every day | ORAL | 2 refills | Status: DC

## 2017-02-28 ENCOUNTER — Ambulatory Visit (INDEPENDENT_AMBULATORY_CARE_PROVIDER_SITE_OTHER): Payer: BLUE CROSS/BLUE SHIELD | Admitting: Podiatry

## 2017-02-28 ENCOUNTER — Encounter: Payer: Self-pay | Admitting: Podiatry

## 2017-02-28 DIAGNOSIS — M79676 Pain in unspecified toe(s): Secondary | ICD-10-CM

## 2017-02-28 DIAGNOSIS — E11628 Type 2 diabetes mellitus with other skin complications: Secondary | ICD-10-CM

## 2017-02-28 DIAGNOSIS — B351 Tinea unguium: Secondary | ICD-10-CM | POA: Diagnosis not present

## 2017-02-28 NOTE — Progress Notes (Signed)
Subjective: 51 y.o. returns the office today for painful, elongated, thickened toenails which he cannot trim himself. Denies any redness or drainage around the nails. He has been using the compound cream for nail fungus. He has not been using regularly. Denies any acute changes since last appointment and no new complaints today. Denies any systemic complaints such as fevers, chills, nausea, vomiting.   Objective: AAO 3, NAD DP/PT pulses palpable, CRT less than 3 seconds Nails hypertrophic, dystrophic, elongated, brittle, discolored 10. There is tenderness overlying the nails 1-5 bilaterally. There is no surrounding erythema or drainage along the nail sites. No open lesions or pre-ulcerative lesions are identified. No other areas of tenderness bilateral lower extremities. No overlying edema, erythema, increased warmth. No pain with calf compression, swelling, warmth, erythema.  Assessment: Patient presents with symptomatic onychomycosis  Plan: -Treatment options including alternatives, risks, complications were discussed -Nails sharply debrided 10 without complication/bleeding. -Continue compound cream for nail fungus. Also discussed urea cream to help thin the toenails.  -Discussed daily foot inspection. If there are any changes, to call the office immediately.  -Follow-up in 3 months or sooner if any problems are to arise. In the meantime, encouraged to call the office with any questions, concerns, changes symptoms.  Celesta Gentile, DPM

## 2017-03-07 ENCOUNTER — Encounter: Payer: Self-pay | Admitting: Family Medicine

## 2017-03-07 ENCOUNTER — Ambulatory Visit (INDEPENDENT_AMBULATORY_CARE_PROVIDER_SITE_OTHER): Payer: BLUE CROSS/BLUE SHIELD | Admitting: Family Medicine

## 2017-03-07 DIAGNOSIS — M25562 Pain in left knee: Secondary | ICD-10-CM | POA: Diagnosis not present

## 2017-03-07 MED ORDER — DICLOFENAC SODIUM 75 MG PO TBEC: 75.0000 mg | DELAYED_RELEASE_TABLET | Freq: Two times a day (BID) | ORAL | 1 refills | Status: DC

## 2017-03-07 NOTE — Patient Instructions (Signed)
I'm concerned you may have torn your medial meniscus again vs flared up arthritis here. Avoid deep squats, lunges, leg press. In about 2-4 weeks it's ok to add half-squats and half-lunges. Use exercise bike, pool if you have access to one. Work your way up to using the elliptical in 2-4 weeks as tolerated. Straight leg raises, knee extensions, hamstring curls, swings 3 sets of 10 once a day. Use ankle weight for resistance. Diclofenac 75mg  twice a day with food for pain and inflammation. Follow up with me in 1 month. Consider physical therapy, injection if not improving. It's doubtful imaging would help [place] at this point.

## 2017-03-08 DIAGNOSIS — M25562 Pain in left knee: Secondary | ICD-10-CM | POA: Insufficient documentation

## 2017-03-08 NOTE — Progress Notes (Signed)
PCP: Ann Held, DO  Subjective:   HPI: Patient is a 51 y.o. male here for bilateral knee pain.  3/7: Patient reports both his knees are swollen and stiff the past 3 weeks. No known injury or trauma. Up and down a lot more at his job at [employer] in this time. Pain level 5/10 on right, 10/10 left and sharp. Pain anterior but radiates to sides and posterior especially in left knee. Right more achy than left. Tried advil, tylenol, brace. No skin changes, fever, other complaints.  4/3: Patient reports right knee pain is down to 1/10. However, feels like left knee injection did not do anything - pain 10/10. Very slow in moving.  Pain is sharp, medial. Worse especially with stairs. No skin changes, fever.  4/10: Patient returns with continued left knee pain and to review MRI. Pain is 10/10, lots of difficulty getting around. Swelling not as bad as before. No skin changes.  06/17/16: Patient reports overall he's doing well with physical therapy. Pain level is 0/10 at rest, can get sore with a lot of work. Feels unstable with stairs. No swelling. Is using a knee brace which helps. Improves with rest. No skin changes, numbness.  03/07/17: Patient reports left knee worsened again on 3/15. Pain up to 9/10, sharp. Started when bowling - had left foot planted and felt a pop medially. Knee feels more unstable now since then. Worse with stairs. Some swelling. No skin changes, numbness.  Past Medical History:  Diagnosis Date  . Cataract    starting  . DIABETES MELLITUS, TYPE II 09/13/2007  . DISEASE, CHRONIC NONALCOHOLIC LIVER NOS 31/04/4007  . FATTY LIVER DISEASE 10/29/2008  . GOITER, NONTOXIC MULTINODULAR 09/22/2007  . Hyperlipidemia   . Preventative health care 10/03/2015    Current Outpatient Prescriptions on File Prior to Visit  Medication Sig Dispense Refill  . aspirin 81 MG chewable tablet Chew 81 mg by mouth daily.    Marland Kitchen atorvastatin (LIPITOR) 40 MG tablet Take  1 tablet (40 mg total) by mouth daily. 30 tablet 2  . azelastine (ASTELIN) 0.1 % nasal spray Place 2 sprays into both nostrils 2 (two) times daily. Use in each nostril as directed 30 mL 1  . DEPO-TESTOSTERONE 100 MG/ML injection Inject 1 mL into the muscle once a week.  2  . Fluocinonide 0.1 % CREA     . fluticasone (FLONASE) 50 MCG/ACT nasal spray Place 2 sprays into both nostrils daily. 16 g 1  . glucose blood test strip Use as instructed bid 100 each 12  . Insulin Pen Needle 31G X 5 MM MISC by Does not apply route 4 (four) times daily.    . NONFORMULARY OR COMPOUNDED ITEM [facility]:  Onychomycosis Nail Lacquer - Fluconazole 2%, Terbinafine 1%, DMSO apply daily to affected area. 120 each 2  . sildenafil (REVATIO) 20 MG tablet Take 5 tablets by mouth daily.  0  . XULTOPHY 100-3.6 UNIT-MG/ML SOPN INJECT 25 UNITS INJECT BELOW THE SKIN DAILY (NON FORMULARY)  5   No current facility-administered medications on file prior to visit.     Past Surgical History:  Procedure Laterality Date  . WISDOM TOOTH EXTRACTION      Allergies  Allergen Reactions  . Shellfish-Derived Products Swelling and Hives  . Shrimp [Shellfish Allergy] Hives and Swelling    Social History   Social History  . Marital status: Married    Spouse name: N/A  . Number of children: N/A  . Years of education:  N/A   Occupational History  . 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 Smoking status: Former Research scientist (life sciences)  . Smokeless tobacco: Never Used  . Alcohol use 0.0 oz/week     Comment: rare   . Drug use: No  . Sexual activity: Not on file   Other Topics Concern  . Not on file   Social History Narrative  . No narrative on file    Family History  Problem Relation Age of Onset  . Diabetes Other     1st degree relative  . Hypertension Other   . Heart disease Other     FH of Cardiac Murmur  . Cancer Other     Colon Cancer- cousin   . Esophageal cancer Maternal  Uncle   . Rectal cancer Neg Hx   . Stomach cancer Neg Hx   . Colon polyps Neg Hx     BP 125/85   Pulse 74   Ht 6' (1.829 m)   Wt 181 lb (82.1 kg)   BMI 24.55 kg/m   Review of Systems: See HPI above.    Objective:  Physical Exam:  Gen: NAD, comfortable in exam room  Left knee: Mild effusion.  No other gross deformity, ecchymoses. TTP medial joint line.  No other tenderness. FROM. Negative ant/post drawers.  Negative valgus/varus stress.  Negative lachmanns. Positive apleys, mcmurrays.  Negative patellar apprehension. NV intact distally.  Right knee: FROM without pain.    Assessment & Plan:  1. Left knee pain - 2/2 new medial meniscus tear vs flare of arthritis.  Avoid lunges, squats, leg press.  Diclofenac, reviewed home exercise program.  F/u in 1 month.  Consider PT, injection if not improving.

## 2017-03-08 NOTE — Assessment & Plan Note (Signed)
2/2 new medial meniscus tear vs flare of arthritis.  Avoid lunges, squats, leg press.  Diclofenac, reviewed home exercise program.  F/u in 1 month.  Consider PT, injection if not improving.

## 2017-03-21 ENCOUNTER — Ambulatory Visit: Payer: BLUE CROSS/BLUE SHIELD | Admitting: Podiatry

## 2017-04-04 ENCOUNTER — Ambulatory Visit: Payer: BLUE CROSS/BLUE SHIELD | Admitting: Family Medicine

## 2017-05-15 ENCOUNTER — Other Ambulatory Visit: Payer: Self-pay | Admitting: Family Medicine

## 2017-05-30 ENCOUNTER — Ambulatory Visit: Payer: BLUE CROSS/BLUE SHIELD | Admitting: Podiatry

## 2017-06-13 ENCOUNTER — Ambulatory Visit (INDEPENDENT_AMBULATORY_CARE_PROVIDER_SITE_OTHER): Payer: BLUE CROSS/BLUE SHIELD | Admitting: Podiatry

## 2017-06-13 ENCOUNTER — Encounter: Payer: Self-pay | Admitting: Podiatry

## 2017-06-13 DIAGNOSIS — M79676 Pain in unspecified toe(s): Secondary | ICD-10-CM

## 2017-06-13 DIAGNOSIS — B351 Tinea unguium: Secondary | ICD-10-CM | POA: Diagnosis not present

## 2017-06-14 NOTE — Progress Notes (Signed)
Subjective: 51 y.o. returns the office today for painful, elongated, thickened toenails which he cannot trim himself. Denies any redness or drainage around the nails. He has continued with the compound cream for nail fungus.  Denies any acute changes since last appointment and no new complaints today. Denies any systemic complaints such as fevers, chills, nausea, vomiting.   Objective: AAO 3, NAD DP/PT pulses palpable, CRT less than 3 seconds Nails hypertrophic, dystrophic, elongated, brittle, discolored 10. There is tenderness overlying the nails 1-5 bilaterally. There is no surrounding erythema or drainage along the nail sites. Overall, exam is unchanged.  No open lesions or pre-ulcerative lesions are identified. No other areas of tenderness bilateral lower extremities. No overlying edema, erythema, increased warmth. No pain with calf compression, swelling, warmth, erythema.  Assessment: Patient presents with symptomatic onychomycosis  Plan: -Treatment options including alternatives, risks, complications were discussed -Nails sharply debrided 10 without complication/bleeding. -Continue compound cream for nail fungus. Also again discussed urea cream to help thin the toenails.  -Discussed daily foot inspection. If there are any changes, to call the office immediately.  -Follow-up in 3 months or sooner if any problems are to arise. In the meantime, encouraged to call the office with any questions, concerns, changes symptoms.  Celesta Gentile, DPM

## 2017-07-11 ENCOUNTER — Encounter: Payer: Self-pay | Admitting: Family Medicine

## 2017-07-11 ENCOUNTER — Ambulatory Visit (INDEPENDENT_AMBULATORY_CARE_PROVIDER_SITE_OTHER): Payer: BLUE CROSS/BLUE SHIELD | Admitting: Family Medicine

## 2017-07-11 ENCOUNTER — Ambulatory Visit: Payer: BLUE CROSS/BLUE SHIELD | Admitting: Family Medicine

## 2017-07-11 VITALS — BP 106/80 | HR 82 | Temp 98.6°F | Resp 16 | Ht 72.0 in | Wt 186.0 lb

## 2017-07-11 DIAGNOSIS — E1165 Type 2 diabetes mellitus with hyperglycemia: Secondary | ICD-10-CM

## 2017-07-11 DIAGNOSIS — Z0000 Encounter for general adult medical examination without abnormal findings: Secondary | ICD-10-CM | POA: Diagnosis not present

## 2017-07-11 DIAGNOSIS — I10 Essential (primary) hypertension: Secondary | ICD-10-CM | POA: Diagnosis not present

## 2017-07-11 DIAGNOSIS — E1151 Type 2 diabetes mellitus with diabetic peripheral angiopathy without gangrene: Secondary | ICD-10-CM

## 2017-07-11 DIAGNOSIS — IMO0002 Reserved for concepts with insufficient information to code with codable children: Secondary | ICD-10-CM

## 2017-07-11 DIAGNOSIS — E785 Hyperlipidemia, unspecified: Secondary | ICD-10-CM | POA: Diagnosis not present

## 2017-07-11 LAB — COMPREHENSIVE METABOLIC PANEL
ALBUMIN: 4 g/dL (ref 3.5–5.2)
ALT: 36 U/L (ref 0–53)
AST: 21 U/L (ref 0–37)
Alkaline Phosphatase: 82 U/L (ref 39–117)
BILIRUBIN TOTAL: 0.5 mg/dL (ref 0.2–1.2)
BUN: 12 mg/dL (ref 6–23)
CO2: 29 mEq/L (ref 19–32)
CREATININE: 0.75 mg/dL (ref 0.40–1.50)
Calcium: 9.3 mg/dL (ref 8.4–10.5)
Chloride: 97 mEq/L (ref 96–112)
GFR: 140.64 mL/min (ref >60.00)
GLUCOSE: 300 mg/dL — AB (ref 70–99)
POTASSIUM: 4.5 meq/L (ref 3.5–5.1)
SODIUM: 133 meq/L — AB (ref 135–145)
TOTAL PROTEIN: 6.9 g/dL (ref 6.0–8.3)

## 2017-07-11 LAB — LIPID PANEL
CHOLESTEROL: 171 mg/dL (ref 0–200)
HDL: 68.6 mg/dL (ref >39.00)
LDL Cholesterol: 88 mg/dL (ref 0–99)
NONHDL: 102.2
Total CHOL/HDL Ratio: 2
Triglycerides: 70 mg/dL (ref 0.0–149.0)
VLDL: 14 mg/dL (ref 0.0–40.0)

## 2017-07-11 NOTE — Progress Notes (Signed)
Patient ID: Kenneth Durham, male   DOB: 06-07-1966, 51 y.o.   MRN: [id number]     Subjective:  I acted as a Education administrator for Dr. Carollee Durham.  Kenneth Durham, [place]   Patient ID: Kenneth Durham, male    DOB: 1966/05/01, 51 y.o.   MRN: [id number]  Chief Complaint  Patient presents with  . Hypertension  . Hyperlipidemia    HPI  Patient is in today for follow up blood pressure and cholesterol.   Patient Care Team: Kenneth Durham, Kenneth Apa, DO as PCP - General (Family Medicine) Kenneth Mullet, MD as Consulting Physician (Ophthalmology) Kenneth Pina, FNP as Nurse Practitioner (Endocrinology) Kenneth Hughs, MD as Attending Physician (Urology) Kenneth Montane, MD as Consulting Physician (Otolaryngology)   Past Medical History:  Diagnosis Date  . Cataract    starting  . DIABETES MELLITUS, TYPE II 09/13/2007  . DISEASE, CHRONIC NONALCOHOLIC LIVER NOS 53/05/1442  . FATTY LIVER DISEASE 10/29/2008  . GOITER, NONTOXIC MULTINODULAR 09/22/2007  . Hyperlipidemia   . Preventative health care 10/03/2015    Past Surgical History:  Procedure Laterality Date  . WISDOM TOOTH EXTRACTION      Family History  Problem Relation Age of Onset  . Diabetes Other        1st degree relative  . Hypertension Other   . Heart disease Other        FH of Cardiac Murmur  . Cancer Other        Colon Cancer- cousin   . Esophageal cancer Maternal Uncle   . Rectal cancer Neg Hx   . Stomach cancer Neg Hx   . Colon polyps Neg Hx     Social History   Social History  . Marital status: Married    Spouse name: N/A  . Number of children: N/A  . Years of education: N/A   Occupational History  . 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 Smoking status: Former Research scientist (life sciences)  . Smokeless tobacco: Never Used  . Alcohol use 0.0 oz/week     Comment: rare   . Drug use: No  . Sexual activity: Not on file   Other Topics Concern  . Not on file   Social History Narrative  .  No narrative on file    Outpatient Medications Prior to Visit  Medication Sig Dispense Refill  . aspirin 81 MG chewable tablet Chew 81 mg by mouth daily.    Marland Kitchen atorvastatin (LIPITOR) 40 MG tablet TAKE 1 TABLET (40 MG TOTAL) BY MOUTH DAILY. 30 tablet 2  . diclofenac (VOLTAREN) 75 MG EC tablet Take 1 tablet (75 mg total) by mouth 2 (two) times daily. 60 tablet 1  . glucose blood test strip Use as instructed bid (Patient taking differently: Use as instructed bid) 100 each 12  . Insulin Pen Needle 31G X 5 MM MISC by Does not apply route 4 (four) times daily.    . NONFORMULARY OR COMPOUNDED ITEM [facility]:  Onychomycosis Nail Lacquer - Fluconazole 2%, Terbinafine 1%, DMSO apply daily to affected area. 120 each 2  . sildenafil (REVATIO) 20 MG tablet Take 5 tablets by mouth daily.  0  . XULTOPHY 100-3.6 UNIT-MG/ML SOPN INJECT 25 UNITS INJECT BELOW THE SKIN DAILY (NON FORMULARY)  5  . azelastine (ASTELIN) 0.1 % nasal spray Place 2 sprays into both nostrils 2 (two) times daily. Use in each nostril as directed 30 mL 1  . DEPO-TESTOSTERONE 100 MG/ML  injection Inject 1 mL into the muscle once a week.  2  . Fluocinonide 0.1 % CREA     . fluticasone (FLONASE) 50 MCG/ACT nasal spray Place 2 sprays into both nostrils daily. 16 g 1   No facility-administered medications prior to visit.     Allergies  Allergen Reactions  . Shellfish-Derived Products Swelling and Hives  . Shrimp [Shellfish Allergy] Hives and Swelling    ROS     Objective:    Physical Exam  BP 106/80 (BP Location: Right Arm, Cuff Size: Normal)   Pulse 82   Temp 98.6 F (37 C) (Oral)   Resp 16   Ht 6' (1.829 m)   Wt 186 lb (84.4 kg)   SpO2 95%   BMI 25.23 kg/m  Wt Readings from Last 3 Encounters:  07/11/17 186 lb (84.4 kg)  03/07/17 181 lb (82.1 kg)  01/03/17 188 lb (85.3 kg)   BP Readings from Last 3 Encounters:  07/11/17 106/80  03/07/17 125/85  01/03/17 124/70     Immunization History  Administered  Date(s) Administered  . H1N1 01/27/2009  . Influenza,inj,Quad PF,36+ Mos 03/09/2016  . Influenza-Unspecified 08/20/2016  . Td 12/20/1996  . Tdap 04/05/2016    Health Maintenance  Topic Date Due  . PNEUMOCOCCAL POLYSACCHARIDE VACCINE (1) 07/07/1967  . HIV Screening  07/06/1980  . COLONOSCOPY  07/07/2015  . HEMOGLOBIN A1C  10/05/2016  . OPHTHALMOLOGY EXAM  07/08/2017  . INFLUENZA VACCINE  07/20/2017  . FOOT EXAM  07/11/2018  . URINE MICROALBUMIN  07/11/2018  . TETANUS/TDAP  04/05/2026    Lab Results  Component Value Date   WBC 3.7 (L) 07/05/2016   HGB 13.2 07/05/2016   HCT 39.1 07/05/2016   PLT 293.0 07/05/2016   GLUCOSE 300 (H) 07/11/2017   CHOL 171 07/11/2017   TRIG 70.0 07/11/2017   HDL 68.60 07/11/2017   LDLDIRECT 153.5 01/15/2011   LDLCALC 88 07/11/2017   ALT 36 07/11/2017   AST 21 07/11/2017   NA 133 (L) 07/11/2017   K 4.5 07/11/2017   CL 97 07/11/2017   CREATININE 0.75 07/11/2017   BUN 12 07/11/2017   CO2 29 07/11/2017   TSH 0.44 01/03/2017   PSA 0.40 01/03/2017   HGBA1C 8.8 (H) 04/05/2016   MICROALBUR 14.7 (H) 07/11/2017    Lab Results  Component Value Date   TSH 0.44 01/03/2017   Lab Results  Component Value Date   WBC 3.7 (L) 07/05/2016   HGB 13.2 07/05/2016   HCT 39.1 07/05/2016   MCV 89.6 07/05/2016   PLT 293.0 07/05/2016   Lab Results  Component Value Date   NA 133 (L) 07/11/2017   K 4.5 07/11/2017   CO2 29 07/11/2017   GLUCOSE 300 (H) 07/11/2017   BUN 12 07/11/2017   CREATININE 0.75 07/11/2017   BILITOT 0.5 07/11/2017   ALKPHOS 82 07/11/2017   AST 21 07/11/2017   ALT 36 07/11/2017   PROT 6.9 07/11/2017   ALBUMIN 4.0 07/11/2017   CALCIUM 9.3 07/11/2017   GFR 140.64 07/11/2017   Lab Results  Component Value Date   CHOL 171 07/11/2017   Lab Results  Component Value Date   HDL 68.60 07/11/2017   Lab Results  Component Value Date   LDLCALC 88 07/11/2017   Lab Results  Component Value Date   TRIG 70.0 07/11/2017   Lab  Results  Component Value Date   CHOLHDL 2 07/11/2017   Lab Results  Component Value Date   HGBA1C 8.8 (H)  04/05/2016         Assessment & Plan:   Problem List Items Addressed This Visit      Unprioritized   DM (diabetes mellitus) type II uncontrolled, periph vascular disorder ([place])    hgba1c to be checked minimize simple carbs. Increase exercise as tolerated. Continue current meds      Relevant Orders   Comprehensive metabolic panel (Completed)   Lipid panel (Completed)   Microalbumin / creatinine urine ratio (Completed)   Essential hypertension    Well controlled, no changes to meds. Encouraged heart healthy diet such as the DASH diet and exercise as tolerated.       Relevant Orders   Comprehensive metabolic panel (Completed)   Lipid panel (Completed)   Microalbumin / creatinine urine ratio (Completed)   Hyperlipidemia LDL goal <70 - Primary    Tolerating statin, encouraged heart healthy diet, avoid trans fats, minimize simple carbs and saturated fats. Increase exercise as tolerated      Relevant Orders   Comprehensive metabolic panel (Completed)   Lipid panel (Completed)   Microalbumin / creatinine urine ratio (Completed)    Other Visit Diagnoses    Preventative health care       Relevant Orders   Ambulatory referral to Gastroenterology      I have discontinued Mr. Scicchitano's DEPO-TESTOSTERONE, fluticasone, azelastine, and Fluocinonide. I am also having him maintain his glucose blood, aspirin, Insulin Pen Needle, XULTOPHY, sildenafil, NONFORMULARY OR COMPOUNDED ITEM, diclofenac, and atorvastatin.  No orders of the defined types were placed in this encounter.   CMA served as Education administrator during this visit. History, Physical and Plan performed by medical provider. Documentation and orders reviewed and attested to.  Ann Held, DO

## 2017-07-11 NOTE — Patient Instructions (Signed)

## 2017-07-12 LAB — MICROALBUMIN / CREATININE URINE RATIO
Creatinine,U: 117.5 mg/dL
MICROALB UR: 14.7 mg/dL — AB (ref 0.0–1.9)
Microalb Creat Ratio: 12.5 mg/g (ref 0.0–30.0)

## 2017-07-14 ENCOUNTER — Other Ambulatory Visit: Payer: Self-pay | Admitting: Family Medicine

## 2017-07-14 DIAGNOSIS — R809 Proteinuria, unspecified: Secondary | ICD-10-CM

## 2017-07-15 ENCOUNTER — Telehealth: Payer: Self-pay | Admitting: Family Medicine

## 2017-07-15 ENCOUNTER — Other Ambulatory Visit: Payer: BLUE CROSS/BLUE SHIELD

## 2017-07-15 DIAGNOSIS — I10 Essential (primary) hypertension: Secondary | ICD-10-CM | POA: Insufficient documentation

## 2017-07-15 DIAGNOSIS — E785 Hyperlipidemia, unspecified: Secondary | ICD-10-CM | POA: Insufficient documentation

## 2017-07-15 NOTE — Telephone Encounter (Signed)
Caller name:Richardo Cousineau  Relationship to patient: Can be reached:[phone] Pharmacy:  Reason for call:returning call

## 2017-07-15 NOTE — Assessment & Plan Note (Signed)
hgba1c to be checked minimize simple carbs. Increase exercise as tolerated. Continue current meds 

## 2017-07-15 NOTE — Telephone Encounter (Signed)
See result notes. 

## 2017-07-15 NOTE — Assessment & Plan Note (Signed)
Tolerating statin, encouraged heart healthy diet, avoid trans fats, minimize simple carbs and saturated fats. Increase exercise as tolerated 

## 2017-07-15 NOTE — Assessment & Plan Note (Signed)
Well controlled, no changes to meds. Encouraged heart healthy diet such as the DASH diet and exercise as tolerated.  °

## 2017-07-19 ENCOUNTER — Other Ambulatory Visit: Payer: Self-pay | Admitting: Family Medicine

## 2017-07-19 LAB — PROTEIN, URINE, 24 HOUR
PROTEIN 24H UR: 312 mg/(24.h) — AB (ref <150)
Protein, Urine: 12 mg/dL (ref 5–25)

## 2017-08-12 ENCOUNTER — Encounter: Payer: Self-pay | Admitting: Family Medicine

## 2017-09-12 ENCOUNTER — Encounter: Payer: Self-pay | Admitting: Podiatry

## 2017-09-12 ENCOUNTER — Ambulatory Visit (INDEPENDENT_AMBULATORY_CARE_PROVIDER_SITE_OTHER): Payer: BLUE CROSS/BLUE SHIELD | Admitting: Podiatry

## 2017-09-12 DIAGNOSIS — B351 Tinea unguium: Secondary | ICD-10-CM

## 2017-09-12 DIAGNOSIS — M79676 Pain in unspecified toe(s): Secondary | ICD-10-CM

## 2017-09-12 NOTE — Progress Notes (Signed)
Subjective: 51 y.o. returns the office today for painful, elongated, thickened toenails which he cannot trim himself. Denies any redness or drainage around the nails. He has continued with the urea cream to help with the thickness to the nails, but not the anti-fungal. Denies any acute changes since last appointment and no new complaints today. Denies any systemic complaints such as fevers, chills, nausea, vomiting.   Objective: AAO 3, NAD DP/PT pulses palpable, CRT less than 3 seconds Nails hypertrophic, dystrophic, elongated, brittle, discolored 10. There is tenderness overlying the nails 1-5 bilaterally. There is no surrounding erythema or drainage along the nail sites.  No open lesions or pre-ulcerative lesions are identified. No other areas of tenderness bilateral lower extremities. No overlying edema, erythema, increased warmth. No pain with calf compression, swelling, warmth, erythema.  Assessment: Patient presents with symptomatic onychomycosis  Plan: -Treatment options including alternatives, risks, complications were discussed -Nails sharply debrided 10 without complication/bleeding. -Continue urea cream to help thin the nails. This has appeared to be somewhat improved. He wants to get the nails thinned and then he'll start the antifungal again. -Discussed daily foot inspection. If there are any changes, to call the office immediately.  -Follow-up in 3 months or sooner if any problems are to arise. In the meantime, encouraged to call the office with any questions, concerns, changes symptoms.  Celesta Gentile, DPM

## 2017-10-04 LAB — HM DIABETES EYE EXAM

## 2017-12-12 ENCOUNTER — Ambulatory Visit: Payer: BLUE CROSS/BLUE SHIELD | Admitting: Podiatry

## 2018-01-09 ENCOUNTER — Encounter: Payer: Self-pay | Admitting: Family Medicine

## 2018-01-09 ENCOUNTER — Ambulatory Visit: Payer: BLUE CROSS/BLUE SHIELD | Admitting: Family Medicine

## 2018-01-09 DIAGNOSIS — Z0289 Encounter for other administrative examinations: Secondary | ICD-10-CM

## 2018-01-30 ENCOUNTER — Ambulatory Visit: Payer: BLUE CROSS/BLUE SHIELD | Admitting: Family Medicine

## 2018-02-13 ENCOUNTER — Ambulatory Visit: Payer: BLUE CROSS/BLUE SHIELD | Admitting: Family Medicine

## 2018-02-13 ENCOUNTER — Encounter: Payer: Self-pay | Admitting: Family Medicine

## 2018-02-13 VITALS — BP 120/86 | HR 74 | Temp 97.9°F | Resp 16 | Ht 72.0 in | Wt 185.6 lb

## 2018-02-13 DIAGNOSIS — E1151 Type 2 diabetes mellitus with diabetic peripheral angiopathy without gangrene: Secondary | ICD-10-CM | POA: Diagnosis not present

## 2018-02-13 DIAGNOSIS — E1165 Type 2 diabetes mellitus with hyperglycemia: Secondary | ICD-10-CM | POA: Diagnosis not present

## 2018-02-13 DIAGNOSIS — IMO0002 Reserved for concepts with insufficient information to code with codable children: Secondary | ICD-10-CM

## 2018-02-13 DIAGNOSIS — E7800 Pure hypercholesterolemia, unspecified: Secondary | ICD-10-CM | POA: Diagnosis not present

## 2018-02-13 DIAGNOSIS — E785 Hyperlipidemia, unspecified: Secondary | ICD-10-CM | POA: Diagnosis not present

## 2018-02-13 LAB — COMPREHENSIVE METABOLIC PANEL
ALK PHOS: 78 U/L (ref 39–117)
ALT: 32 U/L (ref 0–53)
AST: 25 U/L (ref 0–37)
Albumin: 4 g/dL (ref 3.5–5.2)
BILIRUBIN TOTAL: 0.6 mg/dL (ref 0.2–1.2)
BUN: 10 mg/dL (ref 6–23)
CO2: 32 mEq/L (ref 19–32)
Calcium: 9.3 mg/dL (ref 8.4–10.5)
Chloride: 103 mEq/L (ref 96–112)
Creatinine, Ser: 0.75 mg/dL (ref 0.40–1.50)
GFR: 140.32 mL/min (ref >60.00)
Glucose, Bld: 104 mg/dL — ABNORMAL HIGH (ref 70–99)
Potassium: 4.1 mEq/L (ref 3.5–5.1)
Sodium: 141 mEq/L (ref 135–145)
TOTAL PROTEIN: 6.8 g/dL (ref 6.0–8.3)

## 2018-02-13 LAB — LIPID PANEL
Cholesterol: 171 mg/dL (ref 0–200)
HDL: 72.8 mg/dL (ref >39.00)
LDL Cholesterol: 90 mg/dL (ref 0–99)
NONHDL: 97.86
Total CHOL/HDL Ratio: 2
Triglycerides: 39 mg/dL (ref 0.0–149.0)
VLDL: 7.8 mg/dL (ref 0.0–40.0)

## 2018-02-13 MED ORDER — ATORVASTATIN CALCIUM 40 MG PO TABS: 40.0000 mg | ORAL_TABLET | Freq: Every day | ORAL | 1 refills | Status: DC

## 2018-02-13 NOTE — Patient Instructions (Signed)

## 2018-02-13 NOTE — Progress Notes (Signed)
Patient ID: Kenneth Durham, male   DOB: 10/18/1966, 52 y.o.   MRN: [id number]    Subjective:  I acted as a Education administrator for Dr. Carollee Herter.  [name], [name]   Patient ID: Kenneth Durham, male    DOB: 07-19-1966, 53 y.o.   MRN: [id number]  Chief Complaint  Patient presents with  . Hyperlipidemia    HPI  Patient is in today for follow up on cholesterol.  He is doing ok on current treatment.  He states he has been a little slack taking medication. Pt sees FNP for his dm in [place]--- we still don't have any records.   He will sign a release today so we can get them.  Patient Care Team: Carollee Herter, Alferd Apa, DO as PCP - General (Family Medicine) Jalene Mullet, MD as Consulting Physician (Ophthalmology) Nanci Pina, FNP as Nurse Practitioner (Endocrinology) Ardis Hughs, MD as Attending Physician (Urology) Melissa Montane, MD as Consulting Physician (Otolaryngology)   Past Medical History:  Diagnosis Date  . Cataract    starting  . DIABETES MELLITUS, TYPE II 09/13/2007  . DISEASE, CHRONIC NONALCOHOLIC LIVER NOS 82/03/2352  . FATTY LIVER DISEASE 10/29/2008  . GOITER, NONTOXIC MULTINODULAR 09/22/2007  . Hyperlipidemia   . Preventative health care 10/03/2015    Past Surgical History:  Procedure Laterality Date  . WISDOM TOOTH EXTRACTION      Family History  Problem Relation Age of Onset  . Diabetes Other        1st degree relative  . Hypertension Other   . Heart disease Other        FH of Cardiac Murmur  . Cancer Other        Colon Cancer- cousin   . Esophageal cancer Maternal Uncle   . Rectal cancer Neg Hx   . Stomach cancer Neg Hx   . Colon polyps Neg Hx     Social History   Socioeconomic History  . Marital status: Married    Spouse name: Not on file  . Number of children: Not on file  . Years of education: Not on file  . Highest education level: Not on file  Social Needs  . Financial resource strain: Not on file  . Food insecurity - worry: Not on file  .  Food insecurity - inability: Not on file  . Transportation needs - medical: Not on file  . Transportation needs - non-medical: Not on file  Occupational History  . 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 Smoking status: Former Research scientist (life sciences)  . Smokeless tobacco: Never Used  Substance and Sexual Activity  . Alcohol use: Yes    Alcohol/week: 0.0 oz    Comment: rare   . Drug use: No  . Sexual activity: Not on file  Other Topics Concern  . Not on file  Social History Narrative  . Not on file    Outpatient Medications Prior to Visit  Medication Sig Dispense Refill  . glucose blood test strip Use as instructed bid (Patient taking differently: Use as instructed bid) 100 each 12  . Insulin Pen Needle 31G X 5 MM MISC by Does not apply route 4 (four) times daily.    . NONFORMULARY OR COMPOUNDED ITEM [facility]:  Onychomycosis Nail Lacquer - Fluconazole 2%, Terbinafine 1%, DMSO apply daily to affected area. 120 each 2  . XULTOPHY 100-3.6 UNIT-MG/ML SOPN INJECT 25 UNITS INJECT BELOW THE SKIN DAILY (  NON FORMULARY)  5  . aspirin 81 MG chewable tablet Chew 81 mg by mouth daily.    Marland Kitchen atorvastatin (LIPITOR) 40 MG tablet TAKE 1 TABLET (40 MG TOTAL) BY MOUTH DAILY. 30 tablet 2  . diclofenac (VOLTAREN) 75 MG EC tablet Take 1 tablet (75 mg total) by mouth 2 (two) times daily. 60 tablet 1  . sildenafil (REVATIO) 20 MG tablet Take 5 tablets by mouth daily.  0   No facility-administered medications prior to visit.     Allergies  Allergen Reactions  . Shellfish-Derived Products Swelling and Hives  . Shrimp [Shellfish Allergy] Hives and Swelling    Review of Systems  Constitutional: Negative for fever and malaise/fatigue.  HENT: Negative for congestion.   Eyes: Negative for blurred vision.  Respiratory: Negative for cough and shortness of breath.   Cardiovascular: Negative for chest pain, palpitations and leg swelling.  Gastrointestinal:  Negative for vomiting.  Musculoskeletal: Negative for back pain.  Skin: Negative for rash.  Neurological: Negative for loss of consciousness and headaches.       Objective:    Physical Exam  Constitutional: He is oriented to person, place, and time. Vital signs are normal. He appears well-developed and well-nourished. He is sleeping.  HENT:  Head: Normocephalic and atraumatic.  Mouth/Throat: Oropharynx is clear and moist.  Eyes: EOM are normal. Pupils are equal, round, and reactive to light.  Neck: Normal range of motion. Neck supple. No thyromegaly present.  Cardiovascular: Normal rate and regular rhythm.  No murmur heard. Pulmonary/Chest: Effort normal and breath sounds normal. No respiratory distress. He has no wheezes. He has no rales. He exhibits no tenderness.  Musculoskeletal: He exhibits no edema or tenderness.  Neurological: He is alert and oriented to person, place, and time.  Skin: Skin is warm and dry.  Psychiatric: He has a normal mood and affect. His behavior is normal. Judgment and thought content normal.  Nursing note and vitals reviewed. Sensory exam of the foot is normal, tested with the monofilament. Good pulses, no lesions or ulcers, good peripheral pulses.   BP 120/86 (BP Location: Right Arm, Cuff Size: Normal)   Pulse 74   Temp 97.9 F (36.6 C) (Oral)   Resp 16   Ht 6' (1.829 m)   Wt 185 lb 9.6 oz (84.2 kg)   SpO2 98%   BMI 25.17 kg/m  Wt Readings from Last 3 Encounters:  02/13/18 185 lb 9.6 oz (84.2 kg)  07/11/17 186 lb (84.4 kg)  03/07/17 181 lb (82.1 kg)   BP Readings from Last 3 Encounters:  02/13/18 120/86  07/11/17 106/80  03/07/17 125/85     Immunization History  Administered Date(s) Administered  . H1N1 01/27/2009  . Influenza,inj,Quad PF,6+ Mos 03/09/2016  . Influenza-Unspecified 08/20/2016  . Td 12/20/1996  . Tdap 04/05/2016    Health Maintenance  Topic Date Due  . PNEUMOCOCCAL POLYSACCHARIDE VACCINE (1) 07/07/1967  . HIV  Screening  07/06/1980  . COLONOSCOPY  07/07/2015  . HEMOGLOBIN A1C  10/05/2016  . INFLUENZA VACCINE  07/20/2017  . FOOT EXAM  07/11/2018  . URINE MICROALBUMIN  07/11/2018  . OPHTHALMOLOGY EXAM  10/04/2018  . TETANUS/TDAP  04/05/2026    Lab Results  Component Value Date   WBC 3.7 (L) 07/05/2016   HGB 13.2 07/05/2016   HCT 39.1 07/05/2016   PLT 293.0 07/05/2016   GLUCOSE 300 (H) 07/11/2017   CHOL 171 07/11/2017   TRIG 70.0 07/11/2017   HDL 68.60 07/11/2017   LDLDIRECT 153.5 01/15/2011  LDLCALC 88 07/11/2017   ALT 36 07/11/2017   AST 21 07/11/2017   NA 133 (L) 07/11/2017   K 4.5 07/11/2017   CL 97 07/11/2017   CREATININE 0.75 07/11/2017   BUN 12 07/11/2017   CO2 29 07/11/2017   TSH 0.44 01/03/2017   PSA 0.40 01/03/2017   HGBA1C 8.8 (H) 04/05/2016   MICROALBUR 14.7 (H) 07/11/2017    Lab Results  Component Value Date   TSH 0.44 01/03/2017   Lab Results  Component Value Date   WBC 3.7 (L) 07/05/2016   HGB 13.2 07/05/2016   HCT 39.1 07/05/2016   MCV 89.6 07/05/2016   PLT 293.0 07/05/2016   Lab Results  Component Value Date   NA 133 (L) 07/11/2017   K 4.5 07/11/2017   CO2 29 07/11/2017   GLUCOSE 300 (H) 07/11/2017   BUN 12 07/11/2017   CREATININE 0.75 07/11/2017   BILITOT 0.5 07/11/2017   ALKPHOS 82 07/11/2017   AST 21 07/11/2017   ALT 36 07/11/2017   PROT 6.9 07/11/2017   ALBUMIN 4.0 07/11/2017   CALCIUM 9.3 07/11/2017   GFR 140.64 07/11/2017   Lab Results  Component Value Date   CHOL 171 07/11/2017   Lab Results  Component Value Date   HDL 68.60 07/11/2017   Lab Results  Component Value Date   LDLCALC 88 07/11/2017   Lab Results  Component Value Date   TRIG 70.0 07/11/2017   Lab Results  Component Value Date   CHOLHDL 2 07/11/2017   Lab Results  Component Value Date   HGBA1C 8.8 (H) 04/05/2016         Assessment & Plan:   Problem List Items Addressed This Visit      Unprioritized   DM (diabetes mellitus) type II uncontrolled,  periph vascular disorder ([place])    Per [place] FNP--- pt will sign a release today for records      Relevant Medications   atorvastatin (LIPITOR) 40 MG tablet   HYPERCHOLESTEROLEMIA    Check labs today Tolerating statin, encouraged heart healthy diet, avoid trans fats, minimize simple carbs and saturated fats. Increase exercise as tolerated      Relevant Medications   atorvastatin (LIPITOR) 40 MG tablet    Other Visit Diagnoses    Hyperlipidemia LDL goal <100    -  Primary   Relevant Medications   atorvastatin (LIPITOR) 40 MG tablet   Other Relevant Orders   Comprehensive metabolic panel   Lipid panel      I have discontinued Allena Katz B. Gratz's aspirin, sildenafil, and diclofenac. I am also having him maintain his glucose blood, Insulin Pen Needle, XULTOPHY, NONFORMULARY OR COMPOUNDED ITEM, and atorvastatin.  Meds ordered this encounter  Medications  . atorvastatin (LIPITOR) 40 MG tablet    Sig: Take 1 tablet (40 mg total) by mouth daily.    Dispense:  90 tablet    Refill:  1    CMA served as Education administrator during this visit. History, Physical and Plan performed by medical provider. Documentation and orders reviewed and attested to.  Ann Held, DO

## 2018-02-13 NOTE — Assessment & Plan Note (Signed)
Check labs today Tolerating statin, encouraged heart healthy diet, avoid trans fats, minimize simple carbs and saturated fats. Increase exercise as tolerated 

## 2018-02-13 NOTE — Assessment & Plan Note (Signed)
Per [place] FNP--- pt will sign a release today for records

## 2018-06-28 ENCOUNTER — Other Ambulatory Visit: Payer: Self-pay | Admitting: Family Medicine

## 2018-08-14 ENCOUNTER — Encounter: Payer: BLUE CROSS/BLUE SHIELD | Admitting: Family Medicine

## 2018-09-25 ENCOUNTER — Other Ambulatory Visit: Payer: Self-pay | Admitting: Family Medicine

## 2018-10-26 ENCOUNTER — Encounter: Payer: BLUE CROSS/BLUE SHIELD | Admitting: Family Medicine

## 2018-10-26 DIAGNOSIS — Z0289 Encounter for other administrative examinations: Secondary | ICD-10-CM

## 2018-10-27 ENCOUNTER — Encounter: Payer: Self-pay | Admitting: Family Medicine

## 2020-02-14 LAB — HM DIABETES EYE EXAM

## 2021-12-01 ENCOUNTER — Other Ambulatory Visit: Payer: Self-pay | Admitting: Surgery

## 2022-08-11 ENCOUNTER — Other Ambulatory Visit: Payer: Self-pay

## 2022-08-11 ENCOUNTER — Encounter (HOSPITAL_COMMUNITY): Payer: Self-pay

## 2022-08-11 ENCOUNTER — Emergency Department (HOSPITAL_COMMUNITY): Payer: BC Managed Care – PPO

## 2022-08-11 ENCOUNTER — Observation Stay (HOSPITAL_COMMUNITY)
Admission: EM | Admit: 2022-08-11 | Discharge: 2022-08-12 | Disposition: A | Discharge status: Home / Self Care | Payer: BC Managed Care – PPO | Attending: Internal Medicine | Admitting: Internal Medicine

## 2022-08-11 DIAGNOSIS — E079 Disorder of thyroid, unspecified: Secondary | ICD-10-CM | POA: Diagnosis not present

## 2022-08-11 DIAGNOSIS — E875 Hyperkalemia: Secondary | ICD-10-CM | POA: Diagnosis present

## 2022-08-11 DIAGNOSIS — E785 Hyperlipidemia, unspecified: Secondary | ICD-10-CM | POA: Diagnosis present

## 2022-08-11 DIAGNOSIS — E1110 Type 2 diabetes mellitus with ketoacidosis without coma: Secondary | ICD-10-CM | POA: Diagnosis not present

## 2022-08-11 DIAGNOSIS — R55 Syncope and collapse: Secondary | ICD-10-CM | POA: Diagnosis not present

## 2022-08-11 DIAGNOSIS — E1100 Type 2 diabetes mellitus with hyperosmolarity without nonketotic hyperglycemic-hyperosmolar coma (NKHHC): Secondary | ICD-10-CM | POA: Diagnosis not present

## 2022-08-11 DIAGNOSIS — Z87891 Personal history of nicotine dependence: Secondary | ICD-10-CM | POA: Insufficient documentation

## 2022-08-11 DIAGNOSIS — E1165 Type 2 diabetes mellitus with hyperglycemia: Principal | ICD-10-CM | POA: Insufficient documentation

## 2022-08-11 DIAGNOSIS — Z79899 Other long term (current) drug therapy: Secondary | ICD-10-CM | POA: Diagnosis not present

## 2022-08-11 DIAGNOSIS — R531 Weakness: Secondary | ICD-10-CM | POA: Diagnosis present

## 2022-08-11 DIAGNOSIS — E871 Hypo-osmolality and hyponatremia: Secondary | ICD-10-CM | POA: Insufficient documentation

## 2022-08-11 DIAGNOSIS — N179 Acute kidney failure, unspecified: Secondary | ICD-10-CM | POA: Diagnosis present

## 2022-08-11 DIAGNOSIS — Z7984 Long term (current) use of oral hypoglycemic drugs: Secondary | ICD-10-CM | POA: Insufficient documentation

## 2022-08-11 DIAGNOSIS — Z794 Long term (current) use of insulin: Secondary | ICD-10-CM | POA: Insufficient documentation

## 2022-08-11 DIAGNOSIS — R7989 Other specified abnormal findings of blood chemistry: Secondary | ICD-10-CM

## 2022-08-11 DIAGNOSIS — E860 Dehydration: Secondary | ICD-10-CM | POA: Diagnosis not present

## 2022-08-11 DIAGNOSIS — R0789 Other chest pain: Secondary | ICD-10-CM | POA: Diagnosis not present

## 2022-08-11 DIAGNOSIS — I10 Essential (primary) hypertension: Secondary | ICD-10-CM | POA: Diagnosis present

## 2022-08-11 LAB — CBC
HCT: 44.7 % (ref 39.0–52.0)
Hemoglobin: 15.4 g/dL (ref 13.0–17.0)
MCH: 30.4 pg (ref 26.0–34.0)
MCHC: 34.5 g/dL (ref 30.0–36.0)
MCV: 88.2 fL (ref 80.0–100.0)
Platelets: 217 10*3/uL (ref 150–400)
RBC: 5.07 MIL/uL (ref 4.22–5.81)
RDW: 12 % (ref 11.5–15.5)
WBC: 3.4 10*3/uL — ABNORMAL LOW (ref 4.0–10.5)
nRBC: 0 % (ref 0.0–0.2)

## 2022-08-11 LAB — BASIC METABOLIC PANEL
Anion gap: 13 (ref 5–15)
Anion gap: 14 (ref 5–15)
BUN: 24 mg/dL — ABNORMAL HIGH (ref 6–20)
BUN: 31 mg/dL — ABNORMAL HIGH (ref 6–20)
CO2: 18 mmol/L — ABNORMAL LOW (ref 22–32)
CO2: 23 mmol/L (ref 22–32)
Calcium: 8.1 mg/dL — ABNORMAL LOW (ref 8.9–10.3)
Calcium: 9.9 mg/dL (ref 8.9–10.3)
Chloride: 102 mmol/L (ref 98–111)
Chloride: 98 mmol/L (ref 98–111)
Creatinine, Ser: 0.93 mg/dL (ref 0.61–1.24)
Creatinine, Ser: 1.24 mg/dL (ref 0.61–1.24)
GFR, Estimated: >60 mL/min (ref >60)
GFR, Estimated: >60 mL/min (ref >60)
Glucose, Bld: 342 mg/dL — ABNORMAL HIGH (ref 70–99)
Glucose, Bld: 351 mg/dL — ABNORMAL HIGH (ref 70–99)
Potassium: 4 mmol/L (ref 3.5–5.1)
Potassium: 4.6 mmol/L (ref 3.5–5.1)
Sodium: 133 mmol/L — ABNORMAL LOW (ref 135–145)
Sodium: 135 mmol/L (ref 135–145)

## 2022-08-11 LAB — URINALYSIS, ROUTINE W REFLEX MICROSCOPIC
Bacteria, UA: NONE SEEN
Bilirubin Urine: NEGATIVE
Glucose, UA: >=500 mg/dL — AB
Hgb urine dipstick: NEGATIVE
Ketones, ur: 80 mg/dL — AB
Leukocytes,Ua: NEGATIVE
Nitrite: NEGATIVE
Protein, ur: NEGATIVE mg/dL
Specific Gravity, Urine: 1.024 (ref 1.005–1.030)
pH: 5 (ref 5.0–8.0)

## 2022-08-11 LAB — GLUCOSE, CAPILLARY
Glucose-Capillary: 368 mg/dL — ABNORMAL HIGH (ref 70–99)
Glucose-Capillary: 306 mg/dL — ABNORMAL HIGH (ref 70–99)
Glucose-Capillary: 275 mg/dL — ABNORMAL HIGH (ref 70–99)
Glucose-Capillary: 231 mg/dL — ABNORMAL HIGH (ref 70–99)
Glucose-Capillary: 172 mg/dL — ABNORMAL HIGH (ref 70–99)
Glucose-Capillary: 161 mg/dL — ABNORMAL HIGH (ref 70–99)

## 2022-08-11 LAB — COMPREHENSIVE METABOLIC PANEL
ALT: 21 U/L (ref 0–44)
AST: 16 U/L (ref 15–41)
Albumin: 4.7 g/dL (ref 3.5–5.0)
Alkaline Phosphatase: 133 U/L — ABNORMAL HIGH (ref 38–126)
Anion gap: 22 — ABNORMAL HIGH (ref 5–15)
BUN: 36 mg/dL — ABNORMAL HIGH (ref 6–20)
CO2: 18 mmol/L — ABNORMAL LOW (ref 22–32)
Calcium: 10 mg/dL (ref 8.9–10.3)
Chloride: 88 mmol/L — ABNORMAL LOW (ref 98–111)
Creatinine, Ser: 1.64 mg/dL — ABNORMAL HIGH (ref 0.61–1.24)
GFR, Estimated: 48 mL/min — ABNORMAL LOW (ref >60)
Glucose, Bld: 802 mg/dL (ref 70–99)
Potassium: 7.4 mmol/L (ref 3.5–5.1)
Sodium: 128 mmol/L — ABNORMAL LOW (ref 135–145)
Total Bilirubin: 1.5 mg/dL — ABNORMAL HIGH (ref 0.3–1.2)
Total Protein: 8.7 g/dL — ABNORMAL HIGH (ref 6.5–8.1)

## 2022-08-11 LAB — BLOOD GAS, VENOUS
Acid-base deficit: 4.3 mmol/L — ABNORMAL HIGH (ref 0.0–2.0)
Bicarbonate: 21.6 mmol/L (ref 20.0–28.0)
O2 Saturation: 62.3 %
Patient temperature: 37
pCO2, Ven: 42 mmHg — ABNORMAL LOW (ref 44–60)
pH, Ven: 7.32 (ref 7.25–7.43)
pO2, Ven: 38 mmHg (ref 32–45)

## 2022-08-11 LAB — MRSA NEXT GEN BY PCR, NASAL: MRSA by PCR Next Gen: NOT DETECTED

## 2022-08-11 LAB — CBG MONITORING, ED
Glucose-Capillary: >600 mg/dL (ref 70–99)
Glucose-Capillary: >600 mg/dL (ref 70–99)
Glucose-Capillary: >600 mg/dL (ref 70–99)
Glucose-Capillary: 530 mg/dL (ref 70–99)

## 2022-08-11 LAB — OSMOLALITY: Osmolality: 326 mOsm/kg (ref 275–295)

## 2022-08-11 LAB — TROPONIN I (HIGH SENSITIVITY)
Troponin I (High Sensitivity): 5 ng/L (ref <18)
Troponin I (High Sensitivity): 5 ng/L (ref <18)

## 2022-08-11 LAB — BETA-HYDROXYBUTYRIC ACID: Beta-Hydroxybutyric Acid: 7.8 mmol/L — ABNORMAL HIGH (ref 0.05–0.27)

## 2022-08-11 LAB — LIPASE, BLOOD: Lipase: 28 U/L (ref 11–51)

## 2022-08-11 MED ORDER — ACETAMINOPHEN 325 MG PO TABS: 650.0000 mg | ORAL_TABLET | Freq: Four times a day (QID) | ORAL | Status: DC | PRN

## 2022-08-11 MED ORDER — DEXTROSE 50 % IV SOLN: 0.0000 mL | INTRAVENOUS | Status: DC | PRN

## 2022-08-11 MED ORDER — ATORVASTATIN CALCIUM 40 MG PO TABS
40.0000 mg | ORAL_TABLET | Freq: Every day | ORAL | Status: DC
  Administered 2022-08-12: 40 mg via ORAL
  Filled 2022-08-11: qty 1

## 2022-08-11 MED ORDER — ONDANSETRON HCL 4 MG/2ML IJ SOLN: 4.0000 mg | Freq: Four times a day (QID) | INTRAMUSCULAR | Status: DC | PRN

## 2022-08-11 MED ORDER — SODIUM CHLORIDE 0.9% FLUSH
3.0000 mL | Freq: Two times a day (BID) | INTRAVENOUS | Status: DC
Start: 2022-08-11 — End: 2022-08-12
  Administered 2022-08-11 – 2022-08-12 (×2): 3 mL via INTRAVENOUS

## 2022-08-11 MED ORDER — CHLORHEXIDINE GLUCONATE CLOTH 2 % EX PADS
6.0000 | MEDICATED_PAD | Freq: Every day | CUTANEOUS | Status: DC
  Administered 2022-08-11: 6 via TOPICAL

## 2022-08-11 MED ORDER — ONDANSETRON HCL 4 MG/2ML IJ SOLN
4.0000 mg | Freq: Once | INTRAMUSCULAR | Status: DC
  Filled 2022-08-11: qty 2

## 2022-08-11 MED ORDER — LACTATED RINGERS IV SOLN: INTRAVENOUS | Status: DC

## 2022-08-11 MED ORDER — LACTATED RINGERS IV BOLUS
1000.0000 mL | Freq: Once | INTRAVENOUS | Status: AC
  Administered 2022-08-11: 1000 mL via INTRAVENOUS

## 2022-08-11 MED ORDER — LACTATED RINGERS IV BOLUS
20.0000 mL/kg | Freq: Once | INTRAVENOUS | Status: AC
  Administered 2022-08-11: 1542 mL via INTRAVENOUS

## 2022-08-11 MED ORDER — ENOXAPARIN SODIUM 40 MG/0.4ML IJ SOSY
40.0000 mg | PREFILLED_SYRINGE | INTRAMUSCULAR | Status: DC
  Filled 2022-08-11: qty 0.4

## 2022-08-11 MED ORDER — ACETAMINOPHEN 650 MG RE SUPP: 650.0000 mg | Freq: Four times a day (QID) | RECTAL | Status: DC | PRN

## 2022-08-11 MED ORDER — ORAL CARE MOUTH RINSE: 15.0000 mL | OROMUCOSAL | Status: DC | PRN

## 2022-08-11 MED ORDER — INSULIN REGULAR(HUMAN) IN NACL 100-0.9 UT/100ML-% IV SOLN
INTRAVENOUS | Status: DC
  Administered 2022-08-11: 11.5 [IU]/h via INTRAVENOUS
  Administered 2022-08-12: 1.8 [IU]/h via INTRAVENOUS
  Filled 2022-08-11 (×2): qty 100

## 2022-08-11 MED ORDER — ONDANSETRON HCL 4 MG PO TABS: 4.0000 mg | ORAL_TABLET | Freq: Four times a day (QID) | ORAL | Status: DC | PRN

## 2022-08-11 MED ORDER — DEXTROSE IN LACTATED RINGERS 5 % IV SOLN: INTRAVENOUS | Status: DC

## 2022-08-11 NOTE — ED Notes (Signed)
Per [facility], no changes to insulin drip

## 2022-08-11 NOTE — ED Provider Triage Note (Signed)
Emergency Medicine Provider Triage Evaluation Note  Kenneth Durham , a 56 y.o. male  was evaluated in triage.  Pt complains of feeling poorly and hyperglycemia.  States that he was recently switched to [place].  He does not feel that this is doing a good job controlling his blood sugars.  States that blood sugars have been in the 400s for approximately 2 weeks.  He reports thirst and increased urination.  No abdominal pain or vomiting.  No fevers or infectious symptoms.  He just feels bad.  Review of Systems  Positive: Malaise, polydipsia, polyuria Negative: Fever  Physical Exam  BP (!) 133/93   Pulse (!) 115   Temp 98.3 F (36.8 C) (Oral)   Resp 18   Ht 6' (1.829 m)   Wt 77.1 kg   SpO2 99%   BMI 23.06 kg/m  Gen:   Awake, no distress   Resp:  Normal effort  MSK:   Moves extremities without difficulty  Other:  Slightly dry mucous membranes, tachycardia, abdomen soft and nontender  Medical Decision Making  Medically screening exam initiated at 12:25 PM.  Appropriate orders placed.  Su Ley was informed that the remainder of the evaluation will be completed by another provider, this initial triage assessment does not replace that evaluation, and the importance of remaining in the ED until their evaluation is complete.    Carlisle Cater, PA-C 08/11/22 1226

## 2022-08-11 NOTE — ED Provider Notes (Signed)
[facility] DEPT Provider Note   CSN: [id number] Arrival date & time: 08/11/22  1155     History  Chief Complaint  Patient presents with   Hyperglycemia   Nausea    Kenneth Durham is a 56 y.o. male.  Pt is a 56 yo male with a pmhx significant for DM2, goiter, and HLD.  Pt said his PCP changed his DM meds to Ozempic about 2 weeks ago (looks like 8/15 on Epic review).  Pt said his bs have remained high since then.  Pt has had nausea and weakness.  Pt denies any pain.  No f/c.  Pt was supposed to continue to take Metformin, but he has not been taking it.       Home Medications Prior to Admission medications   Medication Sig Start Date End Date Taking? Authorizing Provider  OZEMPIC, 0.25 OR 0.5 MG/DOSE, 2 MG/3ML SOPN Inject 0.25 mg into the skin once a week. 05/24/22  Yes [provider]  atorvastatin (LIPITOR) 40 MG tablet TAKE 1 TABLET BY MOUTH EVERY DAY 09/25/18   Carollee Herter, Kendrick Fries R, DO  glucose blood test strip Use as instructed bid Patient taking differently: Use as instructed bid 06/27/15   Carollee Herter, Alferd Apa, DO  Insulin Pen Needle 31G X 5 MM MISC by Does not apply route 4 (four) times daily.    [provider]  metFORMIN (GLUCOPHAGE-XR) 500 MG 24 hr tablet Take 500 mg by mouth daily. 03/14/22   [provider]  NONFORMULARY OR COMPOUNDED [place]:  Onychomycosis Nail Lacquer - Fluconazole 2%, Terbinafine 1%, DMSO apply daily to affected area. Patient not taking: Reported on 08/11/2022 12/02/16   Trula Slade, DPM  ondansetron (ZOFRAN-ODT) 4 MG disintegrating tablet Take by mouth. Patient not taking: Reported on 08/11/2022 08/10/22   [provider]      Allergies    Shellfish-derived products and Shrimp [shellfish allergy]    Review of Systems   Review of Systems  Gastrointestinal:  Positive for nausea and vomiting.  All other systems reviewed and are negative.   Physical  Exam Updated Vital Signs BP (!) 130/91   Pulse 94   Temp 98.3 F (36.8 C) (Oral)   Resp 13   Ht 6' (1.829 m)   Wt 77.1 kg   SpO2 99%   BMI 23.06 kg/m  Physical Exam Vitals and nursing note reviewed.  Constitutional:      Appearance: Normal appearance.  HENT:     Head: Normocephalic and atraumatic.     Right Ear: External ear normal.     Left Ear: External ear normal.     Nose: Nose normal.     Mouth/Throat:     Mouth: Mucous membranes are dry.  Eyes:     Extraocular Movements: Extraocular movements intact.     Conjunctiva/sclera: Conjunctivae normal.     Pupils: Pupils are equal, round, and reactive to light.  Cardiovascular:     Rate and Rhythm: Regular rhythm. Tachycardia present.     Pulses: Normal pulses.     Heart sounds: Normal heart sounds.  Pulmonary:     Effort: Pulmonary effort is normal.     Breath sounds: Normal breath sounds.  Abdominal:     General: Abdomen is flat. Bowel sounds are normal.     Palpations: Abdomen is soft.  Musculoskeletal:        General: Normal range of motion.     Cervical back: Normal range of motion  and neck supple.  Skin:    General: Skin is warm.     Capillary Refill: Capillary refill takes less than 2 seconds.  Neurological:     General: No focal deficit present.     Mental Status: He is alert and oriented to person, place, and time.  Psychiatric:        Mood and Affect: Mood normal.        Behavior: Behavior normal.     ED Results / Procedures / Treatments   Labs (all labs ordered are listed, but only abnormal results are displayed) Labs Reviewed  CBC - Abnormal; Notable for the following components:      Result Value   WBC 3.4 (*)    All other components within normal limits  BETA-HYDROXYBUTYRIC ACID - Abnormal; Notable for the following components:   Beta-Hydroxybutyric Acid 7.80 (*)    All other components within normal limits  COMPREHENSIVE METABOLIC PANEL - Abnormal; Notable for the following components:    Sodium 128 (*)    Potassium 7.4 (*)    Chloride 88 (*)    CO2 18 (*)    Glucose, Bld 802 (*)    BUN 36 (*)    Creatinine, Ser 1.64 (*)    Total Protein 8.7 (*)    Alkaline Phosphatase 133 (*)    Total Bilirubin 1.5 (*)    GFR, Estimated 48 (*)    Anion gap 22 (*)    All other components within normal limits  BLOOD GAS, VENOUS - Abnormal; Notable for the following components:   pCO2, Ven 42 (*)    Acid-base deficit 4.3 (*)    All other components within normal limits  CBG MONITORING, ED - Abnormal; Notable for the following components:   Glucose-Capillary >600 (*)    All other components within normal limits  CBG MONITORING, ED - Abnormal; Notable for the following components:   Glucose-Capillary >600 (*)    All other components within normal limits  CBG MONITORING, ED - Abnormal; Notable for the following components:   Glucose-Capillary >600 (*)    All other components within normal limits  URINALYSIS, ROUTINE W REFLEX MICROSCOPIC  OSMOLALITY  LIPASE, BLOOD  TROPONIN I (HIGH SENSITIVITY)    EKG EKG Interpretation  Date/Time:  Wednesday August 11 2022 14:13:21 EDT Ventricular Rate:  105 PR Interval:  189 QRS Duration: 95 QT Interval:  319 QTC Calculation: 422 R Axis:   72 Text Interpretation: Sinus tachycardia Borderline ST elevation, anterior leads Baseline wander in lead(s) V2 No old tracing to compare Confirmed by Isla Pence [phone]) on 08/11/2022 3:40:18 PM  Radiology DG Chest 1 View  Result Date: 08/11/2022 CLINICAL DATA:  [id number]; nausea and weakness EXAM: CHEST  1 VIEW COMPARISON:  June 18, 2015 FINDINGS: The heart size and mediastinal contours are within normal limits. Both lungs are clear and stable. The visualized skeletal structures are unremarkable. IMPRESSION: No active disease. Electronically Signed   By: Frazier Richards M.D.   On: 08/11/2022 14:37    Procedures Procedures    Medications Ordered in ED Medications  insulin regular, human (MYXREDLIN)  100 units/ 100 mL infusion (11.5 Units/hr Intravenous New Bag/Given 08/11/22 1443)  lactated ringers infusion ( Intravenous New Bag/Given 08/11/22 1434)  dextrose 5 % in lactated ringers infusion (0 mLs Intravenous Hold 08/11/22 1451)  dextrose 50 % solution 0-50 mL (has no administration in time range)  ondansetron (ZOFRAN) injection 4 mg (0 mg Intravenous Hold 08/11/22 1505)  lactated ringers bolus 1,542 mL (  1,542 mLs Intravenous New Bag/Given 08/11/22 1432)    ED Course/ Medical Decision Making/ A&P                           Medical Decision Making Amount and/or Complexity of Data Reviewed Labs: ordered. Radiology: ordered.  Risk Prescription drug management.   This patient presents to the ED for concern of n/v, this involves an extensive number of treatment options, and is a complaint that carries with it a high risk of complications and morbidity.  The differential diagnosis includes dka, electrolyte abn, infection   Co morbidities that complicate the patient evaluation  DM2, goiter, and HLD   Additional history obtained:  Additional history obtained from epic chart review External records from outside source obtained and reviewed including wife   Lab Tests:  I Ordered, and personally interpreted labs.  The pertinent results include:  cbc nl, cmp with na 128, k elevated at 7.4, glucose 802, co 18, bun 36 and cr 1.64 (new, pt normally has normal kidney function); bhb 7.8   Imaging Studies ordered:  I ordered imaging studies including cxr  I independently visualized and interpreted imaging which showed  IMPRESSION:  No active disease.   I agree with the radiologist interpretation   Cardiac Monitoring:  The patient was maintained on a cardiac monitor.  I personally viewed and interpreted the cardiac monitored which showed an underlying rhythm of: sinus tachy initially; now nsr   Medicines ordered and prescription drug management:  I ordered medication including iv  insulin  for dka  Reevaluation of the patient after these medicines showed that the patient improved I have reviewed the patients home medicines and have made adjustments as needed   Critical Interventions:  Iv insulin   Consultations Obtained:  I requested consultation with the hospitalist (Dr.Vann) ,  and discussed lab and imaging findings as well as pertinent plan - she will admit.   Problem List / ED Course:  DKA:  pt started on IV insulin and is given IVFs Hyperkalemia:  will go down with IVFs and IV insulin Pseudohyponatremia.  Corrected Na is 139   Reevaluation:  After the interventions noted above, I reevaluated the patient and found that they have :improved   Social Determinants of Health:  Lives at home   Dispostion:  After consideration of the diagnostic results and the patients response to treatment, I feel that the patent would benefit from admission.    CRITICAL CARE Performed by: Isla Pence   Total critical care time: 30 minutes  Critical care time was exclusive of separately billable procedures and treating other patients.  Critical care was necessary to treat or prevent imminent or life-threatening deterioration.  Critical care was time spent personally by me on the following activities: development of treatment plan with patient and/or surrogate as well as nursing, discussions with consultants, evaluation of patient's response to treatment, examination of patient, obtaining history from patient or surrogate, ordering and performing treatments and interventions, ordering and review of laboratory studies, ordering and review of radiographic studies, pulse oximetry and re-evaluation of patient's condition.         Final Clinical Impression(s) / ED Diagnoses Final diagnoses:  Diabetic ketoacidosis without coma associated with type 2 diabetes mellitus ([place])  AKI (acute kidney injury) ([place])  Pseudohyponatremia  Hyperkalemia    Rx / DC  Orders ED Discharge Orders     None         Isla Pence,  MD 08/11/22 1541

## 2022-08-11 NOTE — Consult Note (Signed)
ORTHOPAEDIC CONSULTATION  REQUESTING PHYSICIAN: Modena Jansky, MD  Chief Complaint: left shoulder  HPI: Kenneth Durham is a 56 y.o. male who lives alone and works for [employer]. He has a past medical history of type II DM, HTN, HLD, presented to the ED on 08/11/2022 with complaints of approximately 2-week history of generalized weakness, polyuria, polydipsia and chest tightness. Admitted to the hospital for hyperglycemia, dehydration, AKI. Patient noted that a week ago he was lifting something heaving and heard a pop in his left shoulder. Orthopedics was consulted regarding this.  Patient seen in 1231. He states he heard a pop in the left shoulder while lifting something heavy. He notes earlier this week he noticed swelling in his upper arm. He denies any previous left shoulder pain. He is right hand dominant. No other orthopedic complaints. Normally ambulates without assistance. He is not having much issues with pain.   Past Medical History:  Diagnosis Date   Cataract    starting   DIABETES MELLITUS, TYPE II 09/13/2007   DISEASE, CHRONIC NONALCOHOLIC LIVER NOS 64/12/5828   FATTY LIVER DISEASE 10/29/2008   GOITER, NONTOXIC MULTINODULAR 09/22/2007   Hyperlipidemia    Preventative health care 10/03/2015   Past Surgical History:  Procedure Laterality Date   WISDOM TOOTH EXTRACTION     Social History   Socioeconomic History   Marital status: Married    Spouse name: Not on file   Number of children: Not on file   Years of education: Not on file   Highest education level: Not on file  Occupational History   Occupation: Company secretary: [employer]    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  Tobacco Use   Smoking status: Former   Smokeless tobacco: Never  Substance and Sexual Activity   Alcohol use: Yes    Alcohol/week: 0.0 standard drinks of alcohol    Comment: rare    Drug use: No   Sexual activity: Not on file  Other Topics Concern   Not on file  Social History  Narrative   Not on file   Social Determinants of Health   Financial Resource Strain: Not on file  Food Insecurity: Not on file  Transportation Needs: Not on file  Physical Activity: Not on file  Stress: Not on file  Social Connections: Not on file   Family History  Problem Relation Age of Onset   Diabetes Other        1st degree relative   Hypertension Other    Heart disease Other        FH of Cardiac Murmur   Cancer Other        Colon Cancer- cousin    Esophageal cancer Maternal Uncle    Rectal cancer Neg Hx    Stomach cancer Neg Hx    Colon polyps Neg Hx    Allergies  Allergen Reactions   Shellfish-Derived Products Swelling and Hives   Shrimp [Shellfish Allergy] Hives and Swelling   Prior to Admission medications   Medication Sig Start Date End Date Taking? Authorizing Provider  OZEMPIC, 0.25 OR 0.5 MG/DOSE, 2 MG/3ML SOPN Inject 0.25 mg into the skin once a week. 05/24/22  Yes [provider]  atorvastatin (LIPITOR) 40 MG tablet TAKE 1 TABLET BY MOUTH EVERY DAY 09/25/18   Roma Schanz R, DO  glucose blood test strip Use as instructed bid Patient taking differently: Use as instructed bid 06/27/15   Ann Held, DO  Insulin  Pen Needle 31G X 5 MM MISC by Does not apply route 4 (four) times daily.    [provider]  metFORMIN (GLUCOPHAGE-XR) 500 MG 24 hr tablet Take 500 mg by mouth daily. 03/14/22   [provider]  NONFORMULARY OR COMPOUNDED [place]:  Onychomycosis Nail Lacquer - Fluconazole 2%, Terbinafine 1%, DMSO apply daily to affected area. Patient not taking: Reported on 08/11/2022 12/02/16   Trula Slade, DPM  ondansetron (ZOFRAN-ODT) 4 MG disintegrating tablet Take by mouth. Patient not taking: Reported on 08/11/2022 08/10/22   [provider]   DG Chest 1 View  Result Date: 08/11/2022 CLINICAL DATA:  [id number]; nausea and weakness EXAM: CHEST  1 VIEW COMPARISON:  June 18, 2015 FINDINGS: The heart size  and mediastinal contours are within normal limits. Both lungs are clear and stable. The visualized skeletal structures are unremarkable. IMPRESSION: No active disease. Electronically Signed   By: Frazier Richards M.D.   On: 08/11/2022 14:37   Family History Reviewed and non-contributory, no pertinent history of problems with bleeding or anesthesia      Review of Systems 14 system ROS conducted and negative except for that noted in HPI   OBJECTIVE  Vitals:Patient Vitals for the past 8 hrs:  BP Temp Temp src Pulse Resp SpO2 Height Weight  08/11/22 1800 136/84 -- -- 85 14 98 % -- --  08/11/22 1742 123/82 98 F (36.7 C) Oral 85 15 100 % 6' (1.829 m) 79.5 kg  08/11/22 1702 -- 98 F (36.7 C) Oral -- -- -- -- --  08/11/22 1630 (!) 135/90 -- -- 92 15 100 % -- --  08/11/22 1445 (!) 130/91 -- -- 94 13 99 % -- --  08/11/22 1400 (!) 113/98 -- -- (!) 111 18 99 % -- --  08/11/22 1210 (!) 133/93 98.3 F (36.8 C) Oral (!) 115 18 99 % 6' (1.829 m) 77.1 kg   General: Alert, no acute distress Cardiovascular: Warm extremities noted Respiratory: No cyanosis, no use of accessory musculature GI: No organomegaly, abdomen is soft and non-tender Skin: No lesions in the area of chief complaint other than those listed below in MSK exam.  Neurologic: Sensation intact distally save for the below mentioned MSK exam Psychiatric: Patient is competent for consent with normal mood and affect Lymphatic: No swelling obvious and reported other than the area involved in the exam below  Extremities  RUE: actively moves RUE without pain. Strength 5/5. No deformities noted. NVI LUE: Obvious pop-eye deformity consistent with a proximal biceps tear. He has no pain with ROM of the left shoulder. Forward elevation to 160. External rotation to 45 degrees. He has some weakness with supraspinatus testing but no pain. No pain with strength testing. Negative Obriens. Negative impingement. Negative AC tenderness to palpation.  Non-tender about bicipital groove.  Bilateral lower extremity: No swelling, deformity, or effusion. Skin intact. Nontender to palpation, with full and painless ROM throughout. + GS/TA/EHL. Sensation intact in DP/SP/S/S/P distributions. 2+ DP pulse with warm and well perfused digits. Compartments soft and compressible, with no pain on passive stretch.  Test Results Imaging None  Labs cbc Recent Labs    08/11/22 1233  WBC 3.4*  HGB 15.4  HCT 44.7  PLT 217    Labs inflam No results for input(s): "CRP" in the last 72 hours.  Invalid input(s): "ESR"  Labs coag No results for input(s): "INR", "PTT" in the last 72 hours.  Invalid input(s): "PT"  Recent Labs  08/11/22 1233 08/11/22 1639  NA 128* 133*  K 7.4* 4.0  CL 88* 102  CO2 18* 18*  GLUCOSE 802* 342*  BUN 36* 24*  CREATININE 1.64* 0.93  CALCIUM 10.0 8.1*     ASSESSMENT AND PLAN: 56 y.o. male with the following: left proximal biceps tendon tear causing pop-eye deformity  Discussed the nature of the injury at length with the patient. Nothing urgent to be done at this time. Discussed there is a risk of an associated rotator cuff tear. We will monitor him outpatient. If he is having issues with strength or loss of range of motion we will get an MRI outpatient. However if it is just a proximal biceps tear, this does not need repaired surgically unless this bothered him from a cosmetic standpoint.   - Weight Bearing Status/Activity: WBAT, no restrictions - Additional recommended labs/tests: None at this time - VTE Prophylaxis: per primary team - Pain control: per primary team - Follow-up plan: 2 weeks in office for recheck  Contact information:  Weekdays 8-5 Dr. Ophelia Charter, Noemi Chapel PA-C, After hours and holidays please check [email or web address] for group call information for [facility]   Noemi Chapel, PA-C 08/11/2022

## 2022-08-11 NOTE — H&P (Addendum)
History and Physical    Kenneth Durham YPP:[id number] DOB: [birth year] DOA: 08/11/2022  PCP: Vonna Drafts, FNP   I have briefly reviewed patients previous medical reports in [facility].  Patient coming from: Home  Chief Complaint: Generalized weakness, polyuria, polydipsia; passing out and falling 3 days ago.  HPI: Kenneth Durham is a 56 year old male, lives alone, [employer] worker, PMH of type II DM, HTN, HLD, presented to the ED on 08/11/2022 with complaints of approximately 2-week history of generalized weakness, polyuria, polydipsia and chest tightness.  He reports that he was on a diabetic medication (unable to make out the name) until 2 weeks ago when it was changed by his provider to [facility] and since then he reports feeling poorly.  Prior to 2 weeks, he reported that he had a libre 2 and a phone which could monitor his CBGs which were well controlled in the 1 20-70 range.  He reports his last A1c in May was in the 10 range.  Unclear why his diabetic medications were changed 2 weeks ago but since then ongoing progressive symptoms.  Also states that he does not have the [place] and an appropriate phone to monitor his CBGs and has been doing fingerstick blood glucose monitoring and his CBGs lately have been in excess of 400.  I noticed superficial abrasion on his right knee and when inquired, he reports that 3 days PTA, he was sitting and around 9:30 PM, slid off the steps, passed out for about 1 minute with no premonitory symptoms.  He did not seek medical attention at that time.  No prior or subsequent such episodes.  Denies chest pain or palpitations.  Approximately 10 days prior to admission, patient reports that while lifting something heavy at work, he heard a pop in his left shoulder and since then he noted a progressive swelling on the distal aspect of his left ventral upper arm without pain.  ED Course: Transiently tachycardic up to 115 and mildly hypertensive in ED but  other vital signs stable.  Initial lab work: Venous pH 7.32, serum sodium 128, potassium 7.4, chloride 88, bicarbonate 18, blood glucose 802, BUN 36, creatinine 1.64, anion gap 22, WBC 3.4, beta hydroxybutyrate 7.8.  HS Troponin x1: 5.  Lipase 28.  Chest x-ray without acute disease.  Patient was bolused with 2.5 L of IV fluid in the ED and initiated on insulin drip per protocol.  Review of Systems:  All other systems reviewed and apart from HPI, are negative.  Past Medical History:  Diagnosis Date   Cataract    starting   DIABETES MELLITUS, TYPE II 09/13/2007   DISEASE, CHRONIC NONALCOHOLIC LIVER NOS 45/07/997   FATTY LIVER DISEASE 10/29/2008   GOITER, NONTOXIC MULTINODULAR 09/22/2007   Hyperlipidemia    Preventative health care 10/03/2015    Past Surgical History:  Procedure Laterality Date   WISDOM TOOTH EXTRACTION      Social History  reports that he has quit smoking. He has never used smokeless tobacco. He reports current alcohol use. He reports that he does not use drugs.  Allergies  Allergen Reactions   Shellfish-Derived Products Swelling and Hives   Shrimp [Shellfish Allergy] Hives and Swelling    Family History  Problem Relation Age of Onset   Diabetes Other        1st degree relative   Hypertension Other    Heart disease Other        FH of Cardiac Murmur   Cancer Other  Colon Cancer- cousin    Esophageal cancer Maternal Uncle    Rectal cancer Neg Hx    Stomach cancer Neg Hx    Colon polyps Neg Hx      Prior to Admission medications   Medication Sig Start Date End Date Taking? Authorizing Provider  OZEMPIC, 0.25 OR 0.5 MG/DOSE, 2 MG/3ML SOPN Inject 0.25 mg into the skin once a week. 05/24/22  Yes [provider]  atorvastatin (LIPITOR) 40 MG tablet TAKE 1 TABLET BY MOUTH EVERY DAY 09/25/18   Carollee Herter, Kendrick Fries R, DO  glucose blood test strip Use as instructed bid Patient taking differently: Use as instructed bid 06/27/15   Carollee Herter, Alferd Apa,  DO  Insulin Pen Needle 31G X 5 MM MISC by Does not apply route 4 (four) times daily.    [provider]  metFORMIN (GLUCOPHAGE-XR) 500 MG 24 hr tablet Take 500 mg by mouth daily. 03/14/22   [provider]  NONFORMULARY OR COMPOUNDED [place]:  Onychomycosis Nail Lacquer - Fluconazole 2%, Terbinafine 1%, DMSO apply daily to affected area. Patient not taking: Reported on 08/11/2022 12/02/16   Trula Slade, DPM  ondansetron (ZOFRAN-ODT) 4 MG disintegrating tablet Take by mouth. Patient not taking: Reported on 08/11/2022 08/10/22   [provider]    Physical Exam: Vitals:   08/11/22 1400 08/11/22 1445 08/11/22 1630 08/11/22 1702  BP: (!) 113/98 (!) 130/91 (!) 135/90   Pulse: (!) 111 94 92   Resp: '18 13 15   '$ Temp:    98 F (36.7 C)  TempSrc:    Oral  SpO2: 99% 99% 100%   Weight:      Height:          Constitutional: Young male, moderately built and nourished lying comfortably propped up in bed without distress.   Eyes: PERTLA, lids and conjunctivae normal ENMT: Mucous membranes are dry.  Posterior pharynx clear of any exudate or lesions. Normal dentition.  No thrush appreciated. Neck: supple, no masses, no thyromegaly Respiratory: Clear to auscultation without wheezing, rhonchi or crackles. No increased work of breathing. Cardiovascular: S1 & S2 heard, regular rate and rhythm. No JVD, murmurs, rubs or clicks. No pedal edema.  Telemetry personally reviewed: Sinus rhythm. Abdomen: Non distended. Non tender. Soft. No organomegaly or masses appreciated. No clinical Ascites. Normal bowel sounds heard. Musculoskeletal: no clubbing / cyanosis. No joint deformity upper and lower extremities. Good ROM, no contractures. Normal muscle tone.  Left distal ventral upper arm with approximately a golf ball size swelling with no acute findings and not pulsatile. Skin: no rashes, lesions, ulcers. No induration Neurologic: CN 2-12 grossly intact. Sensation  intact, DTR normal. Strength 5/5 in all 4 limbs.  Psychiatric: Normal judgment and insight. Alert and oriented x 3. Normal mood.     Labs on Admission: I have personally reviewed following labs and imaging studies  CBC: Recent Labs  Lab 08/11/22 1233  WBC 3.4*  HGB 15.4  HCT 44.7  MCV 88.2  PLT 481    Basic Metabolic Panel: Recent Labs  Lab 08/11/22 1233  NA 128*  K 7.4*  CL 88*  CO2 18*  GLUCOSE 802*  BUN 36*  CREATININE 1.64*  CALCIUM 10.0    Liver Function Tests: Recent Labs  Lab 08/11/22 1233  AST 16  ALT 21  ALKPHOS 133*  BILITOT 1.5*  PROT 8.7*  ALBUMIN 4.7     Radiological Exams on Admission: DG Chest 1 View  Result Date: 08/11/2022 CLINICAL  DATA:  [id number]; nausea and weakness EXAM: CHEST  1 VIEW COMPARISON:  June 18, 2015 FINDINGS: The heart size and mediastinal contours are within normal limits. Both lungs are clear and stable. The visualized skeletal structures are unremarkable. IMPRESSION: No active disease. Electronically Signed   By: Frazier Richards M.D.   On: 08/11/2022 14:37    EKG: Independently reviewed.  Sinus tachycardia at 105 bpm, normal axis,??  ST elevation versus elevated J-point in 2, 3, aVF, V5 and V6, hyperacute T waves in V2-V4 and QTc 422 ms.  Assessment/Plan Principal Problem:   Hyperosmolar hyperglycemic state (HHS) ([place]) Active Problems:   Hyperlipidemia LDL goal <70   Essential hypertension   Syncope and collapse   Dehydration   AKI (acute kidney injury) (HCC)   Hyperkalemia     Hyperosmolar hyperglycemic state, complicating poorly controlled type II DM: Suspecting medication noncompliance.  Last A1c in [facility] was 8.8 in April 2017 and no labs since.  Presented with blood glucose in excess of 800.  Aggressive IV fluid hydration, initiated insulin drip per protocol, monitor BMPs closely and once his glycemic control is within protocol range, transition to [facility] and SSI.  Requested A1c.  We will likely have to discharge on  insulins until outpatient follow-up with his PCP.  Recommend outpatient endocrinology consultation.  Dehydration with hyponatremia:  Secondary to hyperglycemia.  Aggressive IV fluid hydration and follow BMP.  Acute kidney injury: Most recent baseline creatinine in 2019 was 0.75.  Presented with creatinine of 1.64.  Secondary to dehydration.  Aggressive IV fluid hydration and follow BMP closely.  If does not normalize or if worsens then could consider further evaluation including renal ultrasound.  Hyperkalemia: Secondary to hyperglycemia, AKI.  Above labs were done at around 12:30 PM.  Since then he has been bolused with IV fluids as above and on insulin drip.  Stat BMP has been ordered to recheck his potassium and will also get a repeat EKG.  He had not gotten any medications to address hyperkalemia other than insulin and IV fluids.  Addendum: Repeat EKG just now shows significant improvement with resolution of the abnormal findings i.e. ST elevations and hyperacute T waves.  Syncope and collapse: Occurred 3 days PTA.  EKG findings as above.  Monitor on telemetry.  Check orthostatics.  Getting 2D echo.  Patient has been counseled that he should not drive for 6 months and he verbalized understanding.  ?  Left biceps tear: Appears to have occurred approximately 10 days PTA.  Orthopedics consulted, will evaluate him later today and indicated that this is nonurgent and can be followed up in the office.  Hypertension Hyperlipidemia Thyroid disease Patient denies having any of the above.  Will check TSH.  Monitor BPs.   DVT prophylaxis: Lovenox Code Status: Full, confirmed in the presence of his mother-in-law at bedside. Family Communication: Mother-in-law at bedside Disposition Plan:   Patient is from:  Home  Anticipated DC to:  Home  Anticipated DC date:  08/12/2022  Anticipated DC barriers: None   Consults called: Orthopedics Admission status: Stepdown, observation  Severity of  Illness: The appropriate patient status for this patient is OBSERVATION. Observation status is judged to be reasonable and necessary in order to provide the required intensity of service to ensure the patient's safety. The patient's presenting symptoms, physical exam findings, and initial radiographic and laboratory data in the context of their medical condition is felt to place them at decreased risk for further clinical deterioration. Furthermore, it is anticipated that  the patient will be medically stable for discharge from the hospital within 2 midnights of admission.     Vernell Leep MD [facility]  To contact the attending provider between 7A-7P or the covering provider during after hours 7P-7A, please log into the web site [email or web address] and access using universal [place] password for that web site. If you do not have the password, please call the hospital operator.  08/11/2022, 5:24 PM

## 2022-08-11 NOTE — ED Triage Notes (Signed)
Pt reports high blood sugar readings for about 2 weeks. Pt endorses nausea and weakness. Pt states that he recently started Ozempic.

## 2022-08-11 NOTE — ED Notes (Signed)
Per endotool, no change to insulin drip

## 2022-08-12 ENCOUNTER — Observation Stay (HOSPITAL_BASED_OUTPATIENT_CLINIC_OR_DEPARTMENT_OTHER): Payer: BC Managed Care – PPO

## 2022-08-12 DIAGNOSIS — R55 Syncope and collapse: Secondary | ICD-10-CM | POA: Diagnosis not present

## 2022-08-12 DIAGNOSIS — N179 Acute kidney failure, unspecified: Secondary | ICD-10-CM | POA: Diagnosis not present

## 2022-08-12 DIAGNOSIS — E860 Dehydration: Secondary | ICD-10-CM | POA: Diagnosis not present

## 2022-08-12 DIAGNOSIS — E1100 Type 2 diabetes mellitus with hyperosmolarity without nonketotic hyperglycemic-hyperosmolar coma (NKHHC): Secondary | ICD-10-CM | POA: Diagnosis not present

## 2022-08-12 LAB — GLUCOSE, CAPILLARY
Glucose-Capillary: 125 mg/dL — ABNORMAL HIGH (ref 70–99)
Glucose-Capillary: 150 mg/dL — ABNORMAL HIGH (ref 70–99)
Glucose-Capillary: 152 mg/dL — ABNORMAL HIGH (ref 70–99)
Glucose-Capillary: 152 mg/dL — ABNORMAL HIGH (ref 70–99)
Glucose-Capillary: 153 mg/dL — ABNORMAL HIGH (ref 70–99)
Glucose-Capillary: 158 mg/dL — ABNORMAL HIGH (ref 70–99)
Glucose-Capillary: 176 mg/dL — ABNORMAL HIGH (ref 70–99)

## 2022-08-12 LAB — BASIC METABOLIC PANEL
Anion gap: 9 (ref 5–15)
Anion gap: 7 (ref 5–15)
BUN: 21 mg/dL — ABNORMAL HIGH (ref 6–20)
BUN: 18 mg/dL (ref 6–20)
CO2: 28 mmol/L (ref 22–32)
CO2: 30 mmol/L (ref 22–32)
Calcium: 9.5 mg/dL (ref 8.9–10.3)
Calcium: 9.4 mg/dL (ref 8.9–10.3)
Chloride: 101 mmol/L (ref 98–111)
Chloride: 101 mmol/L (ref 98–111)
Creatinine, Ser: 0.94 mg/dL (ref 0.61–1.24)
Creatinine, Ser: 0.97 mg/dL (ref 0.61–1.24)
GFR, Estimated: >60 mL/min (ref >60)
GFR, Estimated: >60 mL/min (ref >60)
Glucose, Bld: 163 mg/dL — ABNORMAL HIGH (ref 70–99)
Glucose, Bld: 153 mg/dL — ABNORMAL HIGH (ref 70–99)
Potassium: 3.7 mmol/L (ref 3.5–5.1)
Potassium: 3.5 mmol/L (ref 3.5–5.1)
Sodium: 138 mmol/L (ref 135–145)
Sodium: 138 mmol/L (ref 135–145)

## 2022-08-12 LAB — ECHOCARDIOGRAM COMPLETE
AR max vel: 3.17 cm2
AV Peak grad: 4.9 mmHg
Ao pk vel: 1.11 m/s
Area-P 1/2: 2.5 cm2
Height: 72 in
S' Lateral: 2.6 cm
Weight: 2804.25 oz

## 2022-08-12 LAB — CBC
HCT: 36.6 % — ABNORMAL LOW (ref 39.0–52.0)
Hemoglobin: 12.9 g/dL — ABNORMAL LOW (ref 13.0–17.0)
MCH: 30.9 pg (ref 26.0–34.0)
MCHC: 35.2 g/dL (ref 30.0–36.0)
MCV: 87.6 fL (ref 80.0–100.0)
Platelets: 166 10*3/uL (ref 150–400)
RBC: 4.18 MIL/uL — ABNORMAL LOW (ref 4.22–5.81)
RDW: 11.9 % (ref 11.5–15.5)
WBC: 3.9 10*3/uL — ABNORMAL LOW (ref 4.0–10.5)
nRBC: 0 % (ref 0.0–0.2)

## 2022-08-12 LAB — HIV ANTIBODY (ROUTINE TESTING W REFLEX): HIV Screen 4th Generation wRfx: NONREACTIVE

## 2022-08-12 LAB — BETA-HYDROXYBUTYRIC ACID: Beta-Hydroxybutyric Acid: 0.55 mmol/L — ABNORMAL HIGH (ref 0.05–0.27)

## 2022-08-12 LAB — TSH: TSH: 1.209 u[IU]/mL (ref 0.350–4.500)

## 2022-08-12 LAB — OSMOLALITY: Osmolality: 297 mOsm/kg — ABNORMAL HIGH (ref 275–295)

## 2022-08-12 MED ORDER — INSULIN GLARGINE-YFGN 100 UNIT/ML ~~LOC~~ SOLN
20.0000 [IU] | Freq: Every day | SUBCUTANEOUS | Status: DC
  Administered 2022-08-12: 20 [IU] via SUBCUTANEOUS
  Filled 2022-08-12: qty 0.2

## 2022-08-12 MED ORDER — XULTOPHY 100-3.6 UNIT-MG/ML ~~LOC~~ SOPN: 20.0000 [IU] | PEN_INJECTOR | Freq: Every day | SUBCUTANEOUS | 5 refills | Status: AC

## 2022-08-12 MED ORDER — INSULIN ASPART 100 UNIT/ML IJ SOLN: 0.0000 [IU] | Freq: Every day | INTRAMUSCULAR | Status: DC

## 2022-08-12 MED ORDER — INSULIN ASPART 100 UNIT/ML IJ SOLN
0.0000 [IU] | Freq: Three times a day (TID) | INTRAMUSCULAR | Status: DC
  Administered 2022-08-12: 2 [IU] via SUBCUTANEOUS

## 2022-08-12 NOTE — Inpatient Diabetes Management (Addendum)
Inpatient Diabetes Program Recommendations  AACE/ADA: New Consensus Statement on Inpatient Glycemic Control (2015)  Target Ranges:  Prepandial:   less than 140 mg/dL      Peak postprandial:   less than 180 mg/dL (1-2 hours)      Critically ill patients:  140 - 180 mg/dL   Lab Results  Component Value Date   GLUCAP 153 (H) 08/12/2022   HGBA1C 8.8 (H) 04/05/2016    Review of Glycemic Control  Latest Reference Range & Units 08/12/22 02:41 08/12/22 04:36 08/12/22 07:18  Glucose-Capillary 70 - 99 mg/dL 152 (H) 152 (H) 153 (H)  (H): Data is abnormally high Diabetes history: Type 2 DM Outpatient Diabetes medications: Metformin 500 mg QD, Ozempic 0.25 mg qwk Current orders for Inpatient glycemic control: IV insulin to Semglee 20 units QD, Novolog 0-9 units TID & HS  Inpatient Diabetes Program Recommendations:    Noted A1C in process. Of note in chart review, appears patient may have taken Xultophy in the past.  Will plan to see today.   Addendum: Spoke with patient regarding outpatient diabetes management. Patient was recently seen by outpatient PCP on 08/03/22. At this visit Xultophy 35 units QD was discontinued and Ozempic was added Qwk. Patient reports that he was running in the 50-60's mg/dL consistently and was told to increase his doses of Xultophy to 50 units QD. However, patient did not feel comfortable, thus it was discontinued. Once insulin stopped he noted his blood sugars increasing to 400's mg/dL. A1C is pending. Explained what a A1c is and what it measures. Also reviewed goal A1c with patient, importance of good glucose control @ home, and blood sugar goals. Reviewed patho of DM, need for insulin, role of pancreas, DKA, differences between Xultophy and GLPs, survival skills, vascular changes, and other commorbidities. Patient reports checking atleast 2-3 times per day. Has used [employer] in the past. Will provide additional sensor since having multiple episodes of hypoglycemia  while on Xultophy. Discussed the need for basal insulin, however, seems appropriate to reduce doses. In agreement with plan for transition. Anticipate Xultophy at discharge.  Patient recently started on plant based diet and has been working on [employer]. Encouraged to continue to find ways of incorporating protein.  Patient to follow up with PCP in the next few weeks. Discussed using patient portal for communication, when to reach out to MD and the role of endocrinology. Will attach list to DC summary.    Thanks, Bronson Curb, MSN, RNC-OB Diabetes Coordinator [phone] (8a-5p)

## 2022-08-12 NOTE — Discharge Summary (Signed)
Physician Discharge Summary  Kenneth Durham SNK:[id number] DOB: [birth year]  PCP: Vonna Drafts, FNP  Admitted from: Home Discharged to: Home  Admit date: 08/11/2022 Discharge date: 08/12/2022  Recommendations for Outpatient Follow-up:    Follow-up Information     Vonna Drafts, FNP. Schedule an appointment as soon as possible for a visit in 1 week(s).   Specialty: Nurse Practitioner Why: To be seen with repeat labs (CBC & BMP). Contact information: [place] [address] [phone]         Hiram Gash, MD. Schedule an appointment as soon as possible for a visit in 2 week(s).   Specialty: Orthopedic Surgery Contact information: [address] [place] [address] [phone]                  Home Health: None    Equipment/Devices: None    Discharge Condition: Improved and stable.   Code Status: Full Code Diet recommendation:  Discharge Diet Orders (From admission, onward)     Start     Ordered   08/12/22 0000  Diet - low sodium heart healthy        08/12/22 1533   08/12/22 0000  Diet Carb Modified        08/12/22 1533             Discharge Diagnoses:  Principal Problem:   Hyperosmolar hyperglycemic state (HHS) ([place]) Active Problems:   Hyperlipidemia LDL goal <70   Essential hypertension   Syncope and collapse   Dehydration   AKI (acute kidney injury) ([place])   Hyperkalemia   Brief Summary: 56 year old male, lives alone, independent, Oncologist, PMH of type II DM, documented HTN, HLD but patient denies and is not on meds for same, presented to the [facility] ED on 08/11/2022 with complaints of approximately 2 weeks history of generalized weakness, polyuria, polydipsia and chest tightness.  He reports that he was on Xultophy for 5 to 6 years until approximately 2 weeks ago when this was changed to [place] and since then he had been feeling poorly.  He stated that while he was on  Xultophy, his glycemic control was good with CBGs ranging between 120-70 mg per DL range and his last A1c in May 2023 was in the 10 range.  It was unclear why his diabetes meds were changed if his blood sugar controls were good.  He also stated that he no longer had the libre 2 monitor/sensors and appropriate phone to monitor his CBGs and had been doing fingerstick blood glucose monitoring.  His CBGs lately had been in excess of 400.  He also reported a syncopal episode while at work that occurred 3 days PTA and claimed that it was extremely hot and felt that it was because of the heat.  He did not seek medical attention.  He had no premonitory symptoms such as chest pain, palpitations, headache and on waking up, had no symptoms either.  He felt like he had been asleep.  An additional symptom was, 10 days PTA while lifting something heavy at work, he heard a pop in his left shoulder and since then noticed progressive swelling on the distal aspect of the left ventral upper arm without pain.  ED Course: Transiently tachycardic up to 115 and mildly hypertensive in ED but other vital signs stable.  Initial lab work: Venous pH 7.32, serum sodium 128, potassium 7.4, chloride 88, bicarbonate 18, blood glucose 802, BUN 36,  creatinine 1.64, anion gap 22, WBC 3.4, beta hydroxybutyrate 7.8.  HS Troponin x1: 5.  Lipase 28.  Chest x-ray without acute disease.   Patient was bolused with 2.5 L of IV fluid in the ED and initiated on insulin drip per protocol.  Assessment/Plan    Hyperosmolar hyperglycemic state, complicating poorly controlled type II DM: Suspecting medication noncompliance.  Last A1c in [facility] was 8.8 in April 2017 and no A1c since.  Presented with blood glucose in excess of 800.  Aggressive IV fluid hydration, initiated insulin drip per protocol, monitor BMPs closely and once his glycemic control is within protocol range, transition to [facility] and SSI.  Requested A1c and is still pending.  With above  management, his glycemic control improved, AKI and hyperkalemia resolved, he was transitioned to [facility] 20 units daily and SSI.  He received Semglee at approximately 8:30 AM.  His insulin drip was discontinued 2 hours thereafter.  His glycemic control has been good as noted below.  In discussing with him, patient reports that he had already contacted his provider prior to my visit this morning and they were planning to revert back to [facility].  Diabetes coordinator input appreciated and discussed with her at length and agree with recommendations of discontinuing Ozempic, discharging on Xultophy at reduced dose of 20 units daily (to start tomorrow since he got Semglee today), they provided him with some Libre 2 sensors so he can adequately monitor his blood sugars and take to his PCPs appointment for further dose adjustment.  He is already aware of management of hypoglycemic episodes.  Recommend endocrinology consultation, provided him with multiple endocrinologists contact details.  Also consider initiating on ACEI or ARB and statins as outpatient.   Dehydration with hyponatremia:  Secondary to hyperglycemia.  Resolved after aggressive IV fluid hydration.   Acute kidney injury: Most recent baseline creatinine in 2019 was 0.75.  Presented with creatinine of 1.64.  Secondary to dehydration.  Resolved after aggressive IV fluid hydration.   Hyperkalemia: Secondary to hyperglycemia, AKI.  Had EKG changes.  Treated with aggressive IV fluids and insulin drip.  Resolved.  Chest tightness: Unclear etiology.  Transient and resolved.  Initial EKG changes were likely related to hyperkalemia which resolved and no ACS changes thereafter.  HS Troponin x2 negative at 5 and 5.   Syncope and collapse: Occurred 3 days PTA.  Follow-up KG without acute findings.  3 showed sinus rhythm without arrhythmias or pauses.  Orthostatics were not checked on admission and were checked prior to discharge and are mildly positive,?   Diabetic autonomic neuropathy, although patient asymptomatic when performing these orthostatic vital checks.  Clinically euvolemic.  2D echo showed normal LVEF, grade 1 diastolic dysfunction and no aortic stenosis.  Patient has been counseled multiple times since admission, that he should not drive for 6 months and he verbalized understanding.   Left proximal biceps tendon tear Appears to have occurred approximately 10 days PTA.  Orthopedics consulted, input appreciated-recommend supportive care, outpatient follow-up in 2 weeks.   Hypertension Hyperlipidemia Thyroid disease Patient denies having any of the above.  Blood pressure is reasonably controlled.  TSH 1.209.  HIV screen negative.  Mild aortic root dilatation, 40 mm: Noted on 2D echo.  Outpatient follow-up as deemed necessary.   Consultations: None  Procedures: None   Discharge Instructions  Discharge Instructions     Call MD for:   Complete by: As directed    Recurrent passing out episodes.   Call MD for:  difficulty breathing,  headache or visual disturbances   Complete by: As directed    Call MD for:  extreme fatigue   Complete by: As directed    Call MD for:  persistant dizziness or light-headedness   Complete by: As directed    Call MD for:  persistant nausea and vomiting   Complete by: As directed    Call MD for:  severe uncontrolled pain   Complete by: As directed    Call MD for:  temperature >100.4   Complete by: As directed    Diet - low sodium heart healthy   Complete by: As directed    Diet Carb Modified   Complete by: As directed    Driving Restrictions   Complete by: As directed    No driving for 6 months.  This was counseled to patient in the presence of his fiance on day of discharge and his mother-in-law on day of admission.   Increase activity slowly   Complete by: As directed         Medication List     STOP taking these medications    NONFORMULARY OR COMPOUNDED ITEM   ondansetron 4  MG disintegrating tablet Commonly known as: ZOFRAN-ODT   Ozempic (0.25 or 0.5 MG/DOSE) 2 MG/3ML Sopn Generic drug: Semaglutide(0.25 or 0.'5MG'$ /DOS)       TAKE these medications    atorvastatin 40 MG tablet Commonly known as: LIPITOR TAKE 1 TABLET BY MOUTH EVERY DAY   glucose blood test strip Use as instructed bid   Insulin Pen Needle 31G X 5 MM Misc by Does not apply route 4 (four) times daily.   metFORMIN 500 MG 24 hr tablet Commonly known as: GLUCOPHAGE-XR Take 500 mg by mouth daily.   Xultophy 100-3.6 UNIT-MG/ML Sopn Generic drug: Insulin Degludec-Liraglutide Inject 20 Units into the skin daily. Start taking on: August 13, 2022       Allergies  Allergen Reactions   Shellfish-Derived Products Swelling and Hives   Shrimp [Shellfish Allergy] Hives and Swelling      Procedures/Studies: ECHOCARDIOGRAM COMPLETE  Result Date: 08/12/2022    ECHOCARDIOGRAM REPORT   Patient Name:   BRILEY SULTON Date of Exam: 08/12/2022 Medical Rec #:  [id number]          Height:       72.0 in Accession #:    [id number]         Weight:       175.3 lb Date of Birth:  July 13, 1966          BSA:          2.014 m Patient Age:    39 years           BP:           128/89 mmHg Patient Gender: M                  HR:           76 bpm. Exam Location:  Inpatient Procedure: 2D Echo, Cardiac Doppler and Color Doppler Indications:    Syncope  History:        Patient has no prior history of Echocardiogram examinations.                 Risk Factors:Dyslipidemia and Diabetes.  Sonographer:    Jefferey Pica Referring Phys: [id number] Lat[address] D Vivyan Biggers IMPRESSIONS  1. Left ventricular ejection fraction, by estimation, is 60 to 65%. The left ventricle has normal function. The left ventricle has no  regional wall motion abnormalities. Left ventricular diastolic parameters are consistent with Grade I diastolic dysfunction (impaired relaxation).  2. Right ventricular systolic function is normal. The right ventricular size is  normal. Tricuspid regurgitation signal is inadequate for assessing PA pressure.  3. The mitral valve is normal in structure. Trivial mitral valve regurgitation.  4. The aortic valve is tricuspid. There is mild thickening of the aortic valve. Aortic valve regurgitation is not visualized. Aortic valve sclerosis is present, with no evidence of aortic valve stenosis.  5. Aortic dilatation noted. There is mild dilatation of the aortic root, measuring 40 mm.  6. The inferior vena cava is normal in size with greater than 50% respiratory variability, suggesting right atrial pressure of 3 mmHg. Comparison(s): No prior Echocardiogram. FINDINGS  Left Ventricle: Left ventricular ejection fraction, by estimation, is 60 to 65%. The left ventricle has normal function. The left ventricle has no regional wall motion abnormalities. The left ventricular internal cavity size was normal in size. There is  no left ventricular hypertrophy. Left ventricular diastolic parameters are consistent with Grade I diastolic dysfunction (impaired relaxation). Right Ventricle: The right ventricular size is normal. No increase in right ventricular wall thickness. Right ventricular systolic function is normal. Tricuspid regurgitation signal is inadequate for assessing PA pressure. Left Atrium: Left atrial size was normal in size. Right Atrium: Right atrial size was normal in size. Pericardium: There is no evidence of pericardial effusion. Mitral Valve: The mitral valve is normal in structure. Trivial mitral valve regurgitation. Tricuspid Valve: The tricuspid valve is normal in structure. Tricuspid valve regurgitation is trivial. Aortic Valve: The aortic valve is tricuspid. There is mild thickening of the aortic valve. Aortic valve regurgitation is not visualized. Aortic valve sclerosis is present, with no evidence of aortic valve stenosis. Aortic valve peak gradient measures 4.9  mmHg. Pulmonic Valve: The pulmonic valve was not well visualized.  Pulmonic valve regurgitation is trivial. Aorta: Aortic dilatation noted. There is mild dilatation of the aortic root, measuring 40 mm. Venous: The inferior vena cava is normal in size with greater than 50% respiratory variability, suggesting right atrial pressure of 3 mmHg. IAS/Shunts: The atrial septum is grossly normal.  LEFT VENTRICLE PLAX 2D LVIDd:         4.60 cm   Diastology LVIDs:         2.60 cm   LV e' medial:    6.68 cm/s LV PW:         1.00 cm   LV E/e' medial:  7.1 LV IVS:        1.00 cm   LV e' lateral:   12.70 cm/s LVOT diam:     2.10 cm   LV E/e' lateral: 3.7 LV SV:         72 LV SV Index:   36 LVOT Area:     3.46 cm  RIGHT VENTRICLE             IVC RV S prime:     11.00 cm/s  IVC diam: 1.70 cm TAPSE (M-mode): 2.0 cm LEFT ATRIUM             Index        RIGHT ATRIUM           Index LA diam:        2.90 cm 1.44 cm/m   RA Area:     16.40 cm LA Vol (A2C):   45.6 ml 22.64 ml/m  RA Volume:   43.40 ml  21.55  ml/m LA Vol (A4C):   35.5 ml 17.62 ml/m LA Biplane Vol: 43.9 ml 21.79 ml/m  AORTIC VALVE                 PULMONIC VALVE AV Area (Vmax): 3.17 cm     PV Vmax:       0.69 m/s AV Vmax:        110.50 cm/s  PV Peak grad:  1.9 mmHg AV Peak Grad:   4.9 mmHg LVOT Vmax:      101.00 cm/s LVOT Vmean:     70.900 cm/s LVOT VTI:       0.207 m  AORTA Ao Root diam: 4.00 cm Ao Asc diam:  3.40 cm MITRAL VALVE MV Area (PHT): 2.50 cm    SHUNTS MV Decel Time: 303 msec    Systemic VTI:  0.21 m MV E velocity: 47.10 cm/s  Systemic Diam: 2.10 cm MV A velocity: 53.10 cm/s MV E/A ratio:  0.89 Gwyndolyn Kaufman MD Electronically signed by Gwyndolyn Kaufman MD Signature Date/Time: 08/12/2022/11:52:54 AM    Final    DG Chest 1 View  Result Date: 08/11/2022 CLINICAL DATA:  [id number]; nausea and weakness EXAM: CHEST  1 VIEW COMPARISON:  June 18, 2015 FINDINGS: The heart size and mediastinal contours are within normal limits. Both lungs are clear and stable. The visualized skeletal structures are unremarkable. IMPRESSION: No  active disease. Electronically Signed   By: Frazier Richards M.D.   On: 08/11/2022 14:37      Subjective: No complaints.  Weakness, polydipsia, polyuria, chest tightness all resolved.  Anxious to go home.  Discharge Exam:  Vitals:   08/12/22 0706 08/12/22 0800 08/12/22 0900 08/12/22 1200  BP:  132/81 128/89   Pulse: 85 73 75   Resp: '16 10 16   '$ Temp:    97.6 F (36.4 C)  TempSrc:    Oral  SpO2: 98% 99% 100%   Weight:      Height:        General: Pt lying comfortably in bed & appears in no obvious distress. Cardiovascular: S1 & S2 heard, RRR, S1/S2 +. No murmurs, rubs, gallops or clicks. No JVD or pedal edema.  Telemetry personally reviewed: Sinus rhythm. Respiratory: Clear to auscultation without wheezing, rhonchi or crackles. No increased work of breathing. Abdominal:  Non distended, non tender & soft. No organomegaly or masses appreciated. Normal bowel sounds heard. CNS: Alert and oriented. No focal deficits. Extremities: no edema, no cyanosis.  As documented in orthopedics consult note, note obvious "Pop - eye" deformity in left distal ventral upper arm consistent with a proximal biceps tear.  No other acute findings.    The results of significant diagnostics from this hospitalization (including imaging, microbiology, ancillary and laboratory) are listed below for reference.     Microbiology: Recent Results (from the past 240 hour(s))  MRSA Next Gen by PCR, Nasal     Status: None   Collection Time: 08/11/22  4:53 PM   Specimen: Nasal Mucosa; Nasal Swab  Result Value Ref Range Status   MRSA by PCR Next Gen NOT DETECTED NOT DETECTED Final    Comment: (NOTE) The GeneXpert MRSA Assay (FDA approved for NASAL specimens only), is one component of a comprehensive MRSA colonization surveillance program. It is not intended to diagnose MRSA infection nor to guide or monitor treatment for MRSA infections. Test performance is not FDA approved in patients less than 4  years old. Performed at [facility], [address] [place],  [place] [address]      Labs: CBC: Recent Labs  Lab 08/11/22 1233 08/12/22 0520  WBC 3.4* 3.9*  HGB 15.4 12.9*  HCT 44.7 36.6*  MCV 88.2 87.6  PLT 217 355    Basic Metabolic Panel: Recent Labs  Lab 08/11/22 1233 08/11/22 1639 08/11/22 1933 08/12/22 0049 08/12/22 0520  NA 128* 133* 135 138 138  K 7.4* 4.0 4.6 3.7 3.5  CL 88* 102 98 101 101  CO2 18* 18* '23 28 30  '$ GLUCOSE 802* 342* 351* 163* 153*  BUN 36* 24* 31* 21* 18  CREATININE 1.64* 0.93 1.24 0.94 0.97  CALCIUM 10.0 8.1* 9.9 9.5 9.4    Liver Function Tests: Recent Labs  Lab 08/11/22 1233  AST 16  ALT 21  ALKPHOS 133*  BILITOT 1.5*  PROT 8.7*  ALBUMIN 4.7    CBG: Recent Labs  Lab 08/12/22 0436 08/12/22 0718 08/12/22 0915 08/12/22 1104 08/12/22 1157  GLUCAP 152* 153* 125* 158* 176*    Thyroid function studies Recent Labs    08/12/22 0520  TSH 1.209   Urinalysis    Component Value Date/Time   COLORURINE STRAW (A) 08/11/2022 1709   APPEARANCEUR CLEAR 08/11/2022 1709   LABSPEC 1.024 08/11/2022 1709   PHURINE 5.0 08/11/2022 1709   GLUCOSEU >=500 (A) 08/11/2022 1709   HGBUR NEGATIVE 08/11/2022 1709   BILIRUBINUR NEGATIVE 08/11/2022 1709   BILIRUBINUR neg 01/03/2017 0947   KETONESUR 80 (A) 08/11/2022 1709   PROTEINUR NEGATIVE 08/11/2022 1709   UROBILINOGEN 0.2 01/03/2017 0947   NITRITE NEGATIVE 08/11/2022 1709   LEUKOCYTESUR NEGATIVE 08/11/2022 1709    Discussed with fianc at bedside this morning, updated care and answered all questions.  Time coordinating discharge: 25 minutes  SIGNED:  Vernell Leep, MD,  FACP, [facility], [facility], [facility] (Care Management Physician Certified). [facility]  To contact the attending provider between 7A-7P or the covering provider during after hours 7P-7A, please log into the web site [email or web address] and access using universal [place] password for  that web site. If you do not have the password, please call the hospital operator.

## 2022-08-12 NOTE — Discharge Instructions (Addendum)
Local Endocrinologists [facility] [phone]) Dr. Philemon Kingdom Dr. Elayne Snare [facility] [phone]) Dr. Delrae Rend [facility] [phone]) Dr. Jacelyn Pi Dr. Anda Kraft [facility] [phone]) Dr. Daneil Dolin End[facility] [phone]) [place] office]  [phone]) Shari Prows office] Dr. Lavone Orn Dr. Mee Hives [facility]) [phone]) Autumn Barbaraann Barthel Ronnald Ramp), PA Dr. Amalia Greenhouse Dr. Marsh Dolly. [facility] [phone]) Dr. Glade Lloyd Dr. Carolynn Serve. Doerr in [place] [phone])   Additional Discharge Instructions   Please get your medications reviewed and adjusted by your Primary MD.  Please request your Primary MD to go over all Hospital Tests and Procedure/Radiological results at the follow up, please get all Hospital records sent to your Prim MD by signing hospital release before you go home.  If you had Pneumonia of Lung problems at the Hospital: Please get a 2 view Chest X ray done in approximately 4 weeks after hospital discharge or sooner if instructed by your Primary MD.  If you have Congestive Heart Failure: Please call your Cardiologist or Primary MD anytime you have any of the following symptoms:  1) 3 pound weight gain in 24 hours or 5 pounds in 1 week  2) shortness of breath, with or without a dry hacking cough  3) swelling in the hands, feet or stomach  4) if you have to sleep on extra pillows at night in order to breathe  Follow cardiac low salt diet and 1.5 lit/day fluid restriction.  If you have diabetes Accuchecks 4 times/day, Once in AM empty stomach and then before each meal. Log in all results and show them to your primary doctor at your next visit. If any glucose reading is under 80 or above 300 call your primary MD immediately.  If you have  Seizure/Convulsions/Epilepsy: Please do not drive, operate heavy machinery, participate in activities at heights or participate in high speed sports until you have seen by Primary MD or a Neurologist and advised to do so again. Per [facility] statutes, patients with seizures are not allowed to drive until they have been seizure-free for six months.  Use caution when using heavy equipment or power tools. Avoid working on ladders or at heights. Take showers instead of baths. Ensure the water temperature is not too high on the home water heater. Do not go swimming alone. Do not lock yourself in a room alone (i.e. bathroom). When caring for infants or small children, sit down when holding, feeding, or changing them to minimize risk of injury to the child in the event you have a seizure. Maintain good sleep hygiene. Avoid alcohol.   If you had Gastrointestinal Bleeding: Please ask your Primary MD to check a complete blood count within one week of discharge or at your next visit. Your endoscopic/colonoscopic biopsies that are pending at the time of discharge, will also need to followed by your Primary MD.  Get Medicines reviewed and adjusted. Please take all your medications with you for your next visit with your Primary MD  Please request your Primary MD to go over all hospital tests and procedure/radiological results at the follow up, please ask your Primary MD to get all Hospital records sent to his/her office.  If you experience worsening of your admission symptoms, develop shortness of breath, life threatening emergency, suicidal or homicidal thoughts you must seek medical attention immediately by calling 911 or calling your MD immediately  if symptoms less severe.  You must read  complete instructions/literature along with all the possible adverse reactions/side effects for all the Medicines you take and that have been prescribed to you. Take any new Medicines after you have completely  understood and accpet all the possible adverse reactions/side effects.   Do not drive or operate heavy machinery when taking Pain medications.   Do not take more than prescribed Pain, Sleep and Anxiety Medications  Special Instructions: If you have smoked or chewed Tobacco  in the last 2 yrs please stop smoking, stop any regular Alcohol  and or any Recreational drug use.  Wear Seat belts while driving.  Please note You were cared for by a hospitalist during your hospital stay. If you have any questions about your discharge medications or the care you received while you were in the hospital after you are discharged, you can call the unit and asked to speak with the hospitalist on call if the hospitalist that took care of you is not available. Once you are discharged, your primary care physician will handle any further medical issues. Please note that NO REFILLS for any discharge medications will be authorized once you are discharged, as it is imperative that you return to your primary care physician (or establish a relationship with a primary care physician if you do not have one) for your aftercare needs so that they can reassess your need for medications and monitor your lab values.  You can reach the hospitalist office at phone [phone] or fax [phone]   If you do not have a primary care physician, you can call [phone] for a physician referral.

## 2022-08-12 NOTE — Plan of Care (Signed)
  Problem: Clinical Measurements: Goal: Cardiovascular complication will be avoided Outcome: Progressing   Problem: Activity: Goal: Risk for activity intolerance will decrease Outcome: Progressing   Problem: Elimination: Goal: Will not experience complications related to bowel motility Outcome: Progressing   

## 2022-08-12 NOTE — TOC Progression Note (Signed)
Transition of Care [facility]) - Progression Note    Patient Details  Name: CHING RABIDEAU MRN: [id number] Date of Birth: 25-Aug-1966  Transition of Care [facility]) CM/SW Contact  Servando Snare, [place] Phone Number: 08/12/2022, 9:35 AM  Clinical Narrative:      Transition of Care [facility]) Screening Note   Patient Details  Name: MARSEAN ELKHATIB Date of Birth: 12-05-1966   Transition of Care [facility]) CM/SW Contact:    Servando Snare, LCSW Phone Number: 08/12/2022, 9:36 AM    Transition of Care Department [facility]) has reviewed patient and no TOC needs have been identified at this time. We will continue to monitor patient advancement through interdisciplinary progression rounds. If new patient transition needs arise, please place a TOC consult.  TOC consulted for PCP/medication assist. Patient has Secretary/administrator. Client needs to contact insurance co for PCP. Patient not eligible for medication assistance due to insurance.        Expected Discharge Plan and Services                                                 Social Determinants of Health (SDOH) Interventions    Readmission Risk Interventions     No data to display

## 2022-08-13 LAB — HEMOGLOBIN A1C
Hgb A1c MFr Bld: 11.2 % — ABNORMAL HIGH (ref 4.8–5.6)
Mean Plasma Glucose: 275 mg/dL

## 2022-12-29 ENCOUNTER — Ambulatory Visit: Payer: Self-pay | Admitting: Surgery

## 2022-12-29 NOTE — H&P (View-Only) (Signed)
Herma Carson [id number]   Referring Provider:  Selina Cooley, NP   Subjective   Chief Complaint: New Consultation (Sebaceous Cyst on the top of Head)     History of Present Illness:    Patient is following up regarding lesion on his scalp.  He had imaging done over the summer including an ultrasound in July describing a suboptimally evaluated hypoechoic structure in the soft tissue followed by a CT soft tissue done in August that showed a lipoma measuring 1.8 x 2.2 x 0.3 cm along the left posterior vertex.  Since our last meeting he has been admitted to the hospital about 4 months ago with DKA due to medication noncompliance, associated acute kidney injury, syncopal episode, left biceps tendon tear.  Reports his blood sugars are up and down now, but better controlled  11/26/21: 57 year old man with history of diabetes, chronic nonalcoholic liver disease, history of goiter, hyperlipidemia who presents with a lesion on his scalp. He thinks this is probably been present since about 1994, but only noticed it in the last few years after losing a lot of weight.  He does not think it is changed in size or quality since he first noticed it and it does not really cause him any pain.  He is interested in figuring with this is an potentially having it removed.   Review of Systems: A complete review of systems was obtained from the patient.  I have reviewed this information and discussed as appropriate with the patient.  See HPI as well for other ROS.   Medical History: Past Medical History:  Diagnosis Date   Arthritis    Diabetes mellitus without complication (CMS-HCC)     There is no problem list on file for this patient.   History reviewed. No pertinent surgical history.   Allergies  Allergen Reactions   Iodinated Contrast Media Itching    Itching in the facial region with CT contrast   Shellfish Containing Products Other (See Comments), Swelling and Hives   Shrimp Anaphylaxis     Current Outpatient Medications on File Prior to Visit  Medication Sig Dispense Refill   XULTOPHY 100/3.6 100 unit-3.6 mg /mL (3 mL) InPn INJECT BELOW THE SKIN DAILY 40 UNITS     No current facility-administered medications on file prior to visit.    Family History  Problem Relation Age of Onset   Obesity Sister    Diabetes Sister      Social History   Tobacco Use  Smoking Status Former   Types: Cigarettes   Quit date: 1993   Years since quitting: 31.0  Smokeless Tobacco Never     Social History   Socioeconomic History   Marital status: Married  Tobacco Use   Smoking status: Former    Types: Cigarettes    Quit date: 1993    Years since quitting: 31.0   Smokeless tobacco: Never  Vaping Use   Vaping Use: Never used  Substance and Sexual Activity   Alcohol use: Yes   Drug use: Yes    Objective:    Vitals:   12/29/22 0945 12/29/22 0949  BP: (!) 160/80   Pulse: 94   Temp: 36.9 C (98.5 F)   SpO2: 98%   Weight: 89.8 kg (198 lb)   Height: 182.9 cm (6')   PainSc:  0-No pain  PainLoc:  Head - Frontal     Body mass index is 26.85 kg/m.  Alert, well-appearing Unlabored respirations There is a approximately 3 x 2 cm fluctuant  subcutaneous lesion just left of midline in the superior parietal region of his scalp, unchanged from prior  Assessment and Plan:  Diagnoses and all orders for this visit:  Scalp lesion  Lipoma of scalp   Patient would like excision.  We discussed the procedure and risks of bleeding, infection, pain, scarring, lesion recurrence, wound healing problems or undesired cosmetic result.  His risk of infection.poor wound healing is increased by diabetes- he understands this and will continue working on blood sugars. Questions welcomed and answered.  Nirel Babler Raquel James, MD

## 2022-12-29 NOTE — H&P (Signed)
Kenneth Durham [id number]   Referring Provider:  Selina Cooley, NP   Subjective   Chief Complaint: New Consultation (Sebaceous Cyst on the top of Head)     History of Present Illness:    Patient is following up regarding lesion on his scalp.  He had imaging done over the summer including an ultrasound in July describing a suboptimally evaluated hypoechoic structure in the soft tissue followed by a CT soft tissue done in August that showed a lipoma measuring 1.8 x 2.2 x 0.3 cm along the left posterior vertex.  Since our last meeting he has been admitted to the hospital about 4 months ago with DKA due to medication noncompliance, associated acute kidney injury, syncopal episode, left biceps tendon tear.  Reports his blood sugars are up and down now, but better controlled  11/26/21: 57 year old man with history of diabetes, chronic nonalcoholic liver disease, history of goiter, hyperlipidemia who presents with a lesion on his scalp. He thinks this is probably been present since about 1994, but only noticed it in the last few years after losing a lot of weight.  He does not think it is changed in size or quality since he first noticed it and it does not really cause him any pain.  He is interested in figuring with this is an potentially having it removed.   Review of Systems: A complete review of systems was obtained from the patient.  I have reviewed this information and discussed as appropriate with the patient.  See HPI as well for other ROS.   Medical History: Past Medical History:  Diagnosis Date   Arthritis    Diabetes mellitus without complication (CMS-HCC)     There is no problem list on file for this patient.   History reviewed. No pertinent surgical history.   Allergies  Allergen Reactions   Iodinated Contrast Media Itching    Itching in the facial region with CT contrast   Shellfish Containing Products Other (See Comments), Swelling and Hives   Shrimp Anaphylaxis     Current Outpatient Medications on File Prior to Visit  Medication Sig Dispense Refill   XULTOPHY 100/3.6 100 unit-3.6 mg /mL (3 mL) InPn INJECT BELOW THE SKIN DAILY 40 UNITS     No current facility-administered medications on file prior to visit.    Family History  Problem Relation Age of Onset   Obesity Sister    Diabetes Sister      Social History   Tobacco Use  Smoking Status Former   Types: Cigarettes   Quit date: 1993   Years since quitting: 31.0  Smokeless Tobacco Never     Social History   Socioeconomic History   Marital status: Married  Tobacco Use   Smoking status: Former    Types: Cigarettes    Quit date: 1993    Years since quitting: 31.0   Smokeless tobacco: Never  Vaping Use   Vaping Use: Never used  Substance and Sexual Activity   Alcohol use: Yes   Drug use: Yes    Objective:    Vitals:   12/29/22 0945 12/29/22 0949  BP: (!) 160/80   Pulse: 94   Temp: 36.9 C (98.5 F)   SpO2: 98%   Weight: 89.8 kg (198 lb)   Height: 182.9 cm (6')   PainSc:  0-No pain  PainLoc:  Head - Frontal     Body mass index is 26.85 kg/m.  Alert, well-appearing Unlabored respirations There is a approximately 3 x 2 cm fluctuant  subcutaneous lesion just left of midline in the superior parietal region of his scalp, unchanged from prior  Assessment and Plan:  Diagnoses and all orders for this visit:  Scalp lesion  Lipoma of scalp   Patient would like excision.  We discussed the procedure and risks of bleeding, infection, pain, scarring, lesion recurrence, wound healing problems or undesired cosmetic result.  His risk of infection.poor wound healing is increased by diabetes- he understands this and will continue working on blood sugars. Questions welcomed and answered.  Mitzi Lilja Raquel James, MD

## 2023-01-14 NOTE — Progress Notes (Addendum)
PCP - Selina Cooley lov 01-05-23 Cardiologist - no  PPM/ICD -  Device Orders -  Rep Notified -   Chest x-ray - 08-11-22 EKG - 08-12-22 Stress Test -  ECHO -  Cardiac Cath -   Sleep Study -  CPAP -   Fasting Blood Sugar -  Checks Blood Sugar _____ times a day  Blood Thinner Instructions: Aspirin Instructions:  ERAS Protcol - PRE-SURGERY    COVID vaccine -x2  Called pt. To let him know to hold Mounjaro 7 days prior to surgery  Last dose was 01-08-23  Activity--Able to exercise without SOB or CP Anesthesia review: DM, HTN, hgbA1c 10.7  Patient denies shortness of breath, fever, cough and chest pain at PAT appointment   All instructions explained to the patient, with a verbal understanding of the material. Patient agrees to go over the instructions while at home for a better understanding. Patient also instructed to self quarantine after being tested for COVID-19. The opportunity to ask questions was provided.

## 2023-01-14 NOTE — Patient Instructions (Addendum)
SURGICAL WAITING ROOM VISITATION  Patients having surgery or a procedure may have no more than 2 support people in the waiting area - these visitors may rotate.    Children under the age of 67 must have an adult with them who is not the patient.  Due to an increase in RSV and influenza rates and associated hospitalizations, children ages 62 and under may not visit patients in [place].  If the patient needs to stay at the hospital during part of their recovery, the visitor guidelines for inpatient rooms apply. Pre-op nurse will coordinate an appropriate time for 1 support person to accompany patient in pre-op.  This support person may not rotate.    Please refer to the [facility] website for the visitor guidelines for Inpatients (after your surgery is over and you are in a regular room).       Your procedure is scheduled on: 01-21-23   Report to [facility] Main Entrance    Report to admitting at     [place]   AM   Call this number if you have problems the morning of surgery [phone]   Do not eat food  or drink liquids  :After Midnight.                 If you have questions, please contact your surgeon's office.   FOLLOW ANY ADDITIONAL PRE OP INSTRUCTIONS YOU RECEIVED FROM YOUR SURGEON'S OFFICE!!!     Oral Hygiene is also important to reduce your risk of infection.                                    Remember - BRUSH YOUR TEETH THE MORNING OF SURGERY WITH YOUR REGULAR TOOTHPASTE  DENTURES WILL BE REMOVED PRIOR TO SURGERY PLEASE DO NOT APPLY "Poly grip" OR ADHESIVES!!!   Do NOT smoke after Midnight   Take these medicines the morning of surgery with A SIP OF WATER: atorvastatin How to Manage Your Diabetes Before and After Surgery  Why is it important to control my blood sugar before and after surgery? Improving blood sugar levels before and after surgery helps healing and can limit problems. A way of improving blood sugar control is eating a healthy  diet by:  Eating less sugar and carbohydrates  Increasing activity/exercise  Talking with your doctor about reaching your blood sugar goals High blood sugars (greater than 180 mg/dL) can raise your risk of infections and slow your recovery, so you will need to focus on controlling your diabetes during the weeks before surgery. Make sure that the doctor who takes care of your diabetes knows about your planned surgery including the date and location.  How do I manage my blood sugar before surgery? Check your blood sugar at least 4 times a day, starting 2 days before surgery, to make sure that the level is not too high or low. Check your blood sugar the morning of your surgery when you wake up and every 2 hours until you get to the Short Stay unit. If your blood sugar is less than 70 mg/dL, you will need to treat for low blood sugar: Do not take insulin. Treat a low blood sugar (less than 70 mg/dL) with  cup of clear juice (cranberry or apple), 4 glucose tablets, OR glucose gel. Recheck blood sugar in 15 minutes after treatment (to make sure it is greater than 70 mg/dL). If your  blood sugar is not greater than 70 mg/dL on recheck, call [phone] for further instructions. Report your blood sugar to the short stay nurse when you get to Short Stay.  If you are admitted to the hospital after surgery: Your blood sugar will be checked by the staff and you will probably be given insulin after surgery (instead of oral diabetes medicines) to make sure you have good blood sugar levels. The goal for blood sugar control after surgery is 80-180 mg/dL.   WHAT DO I DO ABOUT MY DIABETES MEDICATION?  Do not take oral diabetes medicines (pills) the morning of surgery.  HOLD Mounjaro 7 days prior to surgery      THE MORNING OF SURGERY, take  o units of         insulin.  DO NOT TAKE THE FOLLOWING 7 DAYS PRIOR TO SURGERY: Ozempic, Wegovy, Rybelsus (Semaglutide), Byetta (exenatide), Bydureon (exenatide ER),  Victoza, Saxenda (liraglutide), or Trulicity (dulaglutide) Mounjaro (Tirzepatide) Adlyxin (Lixisenatide), Polyethylene Glycol Loxenatide.  If your CBG is greater than 220 mg/dL, you may take  of your sliding scale  (correction) dose of insulin.  DO NOT TAKE ANY ORAL DIABETIC MEDICATIONS DAY OF YOUR SURGERY  Bring CPAP mask and tubing day of surgery.                              You may not have any metal on your body including hair pins, jewelry, and body piercing             Do not wear  lotions, powders, perfumes/cologne, or deodorant               Men may shave face and neck.   Do not bring valuables to the hospital. [place].   Contacts, glasses, dentures or bridgework may not be worn into surgery.   Bring small overnight bag day of surgery.   DO NOT [place]. PHARMACY WILL DISPENSE MEDICATIONS LISTED ON YOUR MEDICATION LIST TO YOU DURING YOUR ADMISSION [place]!    Patients discharged on the day of surgery will not be allowed to drive home.  Someone NEEDS to stay with you for the first 24 hours after anesthesia.   Special Instructions: Bring a copy of your healthcare power of attorney and living will documents the day of surgery if you haven't scanned them before.              Please read over the following fact sheets you were given: IF [place] [phone]   If you received a COVID test during your pre-op visit  it is requested that you wear a mask when out in public, stay away from anyone that may not be feeling well and notify your surgeon if you develop symptoms. If you test positive for Covid or have been in contact with anyone that has tested positive in the last 10 days please notify you surgeon.    [place] - Preparing for Surgery Before surgery, you can play an important role.  Because skin is not sterile, your skin  needs to be as free of germs as possible.  You can reduce the number of germs on your skin by washing with CHG (chlorahexidine gluconate) soap before surgery.  CHG is an antiseptic  cleaner which kills germs and bonds with the skin to continue killing germs even after washing. Please DO NOT use if you have an allergy to CHG or antibacterial soaps.  If your skin becomes reddened/irritated stop using the CHG and inform your nurse when you arrive at Short Stay. Do not shave (including legs and underarms) for at least 48 hours prior to the first CHG shower.  You may shave your face/neck. Please follow these instructions carefully:  1.  Shower with CHG Soap the night before surgery and the  morning of Surgery.  2.  If you choose to wash your hair, wash your hair first as usual with your  normal  shampoo.  3.  After you shampoo, rinse your hair and body thoroughly to remove the  shampoo.                           4.  Use CHG as you would any other liquid soap.  You can apply chg directly  to the skin and wash                       Gently with a scrungie or clean washcloth.  5.  Apply the CHG Soap to your body ONLY FROM THE NECK DOWN.   Do not use on face/ open                           Wound or open sores. Avoid contact with eyes, ears mouth and genitals (private parts).                       Wash face,  Genitals (private parts) with your normal soap.             6.  Wash thoroughly, paying special attention to the area where your surgery  will be performed.  7.  Thoroughly rinse your body with warm water from the neck down.  8.  DO NOT shower/wash with your normal soap after using and rinsing off  the CHG Soap.                9.  Pat yourself dry with a clean towel.            10.  Wear clean pajamas.            11.  Place clean sheets on your bed the night of your first shower and do not  sleep with pets. Day of Surgery : Do not apply any lotions/deodorants the morning of surgery.  Please wear clean  clothes to the hospital/surgery center.  FAILURE TO FOLLOW THESE INSTRUCTIONS MAY RESULT IN THE CANCELLATION OF YOUR SURGERY PATIENT SIGNATURE_________________________________  NURSE SIGNATURE__________________________________  ________________________________________________________________________

## 2023-01-18 ENCOUNTER — Encounter (HOSPITAL_COMMUNITY): Payer: Self-pay

## 2023-01-18 ENCOUNTER — Encounter (HOSPITAL_COMMUNITY)
Admission: RE | Admit: 2023-01-18 | Discharge: 2023-01-18 | Disposition: A | Discharge status: Home / Self Care | Payer: BC Managed Care – PPO | Source: Ambulatory Visit | Attending: Surgery | Admitting: Surgery

## 2023-01-18 ENCOUNTER — Other Ambulatory Visit: Payer: Self-pay

## 2023-01-18 VITALS — BP 124/89 | HR 84 | Temp 98.8°F | Resp 16 | Ht 72.0 in | Wt 198.0 lb

## 2023-01-18 DIAGNOSIS — I10 Essential (primary) hypertension: Secondary | ICD-10-CM | POA: Diagnosis not present

## 2023-01-18 DIAGNOSIS — I739 Peripheral vascular disease, unspecified: Secondary | ICD-10-CM | POA: Diagnosis not present

## 2023-01-18 DIAGNOSIS — E11628 Type 2 diabetes mellitus with other skin complications: Secondary | ICD-10-CM | POA: Insufficient documentation

## 2023-01-18 DIAGNOSIS — E785 Hyperlipidemia, unspecified: Secondary | ICD-10-CM | POA: Diagnosis not present

## 2023-01-18 DIAGNOSIS — R519 Headache, unspecified: Secondary | ICD-10-CM | POA: Diagnosis not present

## 2023-01-18 DIAGNOSIS — Z01812 Encounter for preprocedural laboratory examination: Secondary | ICD-10-CM | POA: Insufficient documentation

## 2023-01-18 DIAGNOSIS — D170 Benign lipomatous neoplasm of skin and subcutaneous tissue of head, face and neck: Secondary | ICD-10-CM | POA: Diagnosis not present

## 2023-01-18 DIAGNOSIS — Z794 Long term (current) use of insulin: Secondary | ICD-10-CM | POA: Diagnosis not present

## 2023-01-18 DIAGNOSIS — E119 Type 2 diabetes mellitus without complications: Secondary | ICD-10-CM | POA: Diagnosis not present

## 2023-01-18 DIAGNOSIS — Z87891 Personal history of nicotine dependence: Secondary | ICD-10-CM | POA: Diagnosis not present

## 2023-01-18 HISTORY — DX: Headache, unspecified: R51.9

## 2023-01-18 LAB — BASIC METABOLIC PANEL
Anion gap: 10 (ref 5–15)
BUN: 9 mg/dL (ref 6–20)
CO2: 29 mmol/L (ref 22–32)
Calcium: 8.9 mg/dL (ref 8.9–10.3)
Chloride: 100 mmol/L (ref 98–111)
Creatinine, Ser: 0.76 mg/dL (ref 0.61–1.24)
GFR, Estimated: >60 mL/min (ref >60)
Glucose, Bld: 85 mg/dL (ref 70–99)
Potassium: 3.9 mmol/L (ref 3.5–5.1)
Sodium: 139 mmol/L (ref 135–145)

## 2023-01-18 LAB — CBC
HCT: 39.7 % (ref 39.0–52.0)
Hemoglobin: 13.1 g/dL (ref 13.0–17.0)
MCH: 30.5 pg (ref 26.0–34.0)
MCHC: 33 g/dL (ref 30.0–36.0)
MCV: 92.3 fL (ref 80.0–100.0)
Platelets: 270 10*3/uL (ref 150–400)
RBC: 4.3 MIL/uL (ref 4.22–5.81)
RDW: 12.1 % (ref 11.5–15.5)
WBC: 4.4 10*3/uL (ref 4.0–10.5)
nRBC: 0 % (ref 0.0–0.2)

## 2023-01-18 LAB — HEMOGLOBIN A1C
Hgb A1c MFr Bld: 10.7 % — ABNORMAL HIGH (ref 4.8–5.6)
Mean Plasma Glucose: 260.39 mg/dL

## 2023-01-18 LAB — GLUCOSE, CAPILLARY: Glucose-Capillary: 74 mg/dL (ref 70–99)

## 2023-01-21 ENCOUNTER — Encounter (HOSPITAL_COMMUNITY)
Admission: RE | Disposition: A | Discharge status: Home / Self Care | Payer: Self-pay | Source: Home / Self Care | Attending: Surgery

## 2023-01-21 ENCOUNTER — Ambulatory Visit (HOSPITAL_COMMUNITY): Payer: BC Managed Care – PPO | Admitting: Anesthesiology

## 2023-01-21 ENCOUNTER — Encounter (HOSPITAL_COMMUNITY): Payer: Self-pay | Admitting: Surgery

## 2023-01-21 ENCOUNTER — Other Ambulatory Visit: Payer: Self-pay

## 2023-01-21 ENCOUNTER — Ambulatory Visit (HOSPITAL_COMMUNITY): Payer: BC Managed Care – PPO | Admitting: Physician Assistant

## 2023-01-21 ENCOUNTER — Ambulatory Visit (HOSPITAL_COMMUNITY)
Admission: RE | Admit: 2023-01-21 | Discharge: 2023-01-21 | Disposition: A | Discharge status: Home / Self Care | Payer: BC Managed Care – PPO | Attending: Surgery | Admitting: Surgery

## 2023-01-21 DIAGNOSIS — I10 Essential (primary) hypertension: Secondary | ICD-10-CM | POA: Insufficient documentation

## 2023-01-21 DIAGNOSIS — D170 Benign lipomatous neoplasm of skin and subcutaneous tissue of head, face and neck: Secondary | ICD-10-CM | POA: Diagnosis not present

## 2023-01-21 DIAGNOSIS — E11628 Type 2 diabetes mellitus with other skin complications: Secondary | ICD-10-CM

## 2023-01-21 DIAGNOSIS — E785 Hyperlipidemia, unspecified: Secondary | ICD-10-CM | POA: Insufficient documentation

## 2023-01-21 DIAGNOSIS — I739 Peripheral vascular disease, unspecified: Secondary | ICD-10-CM | POA: Insufficient documentation

## 2023-01-21 DIAGNOSIS — Z87891 Personal history of nicotine dependence: Secondary | ICD-10-CM | POA: Insufficient documentation

## 2023-01-21 DIAGNOSIS — Z794 Long term (current) use of insulin: Secondary | ICD-10-CM | POA: Insufficient documentation

## 2023-01-21 DIAGNOSIS — R519 Headache, unspecified: Secondary | ICD-10-CM | POA: Insufficient documentation

## 2023-01-21 DIAGNOSIS — E119 Type 2 diabetes mellitus without complications: Secondary | ICD-10-CM | POA: Insufficient documentation

## 2023-01-21 HISTORY — PX: LIPOMA EXCISION: SHX5283

## 2023-01-21 LAB — GLUCOSE, CAPILLARY
Glucose-Capillary: 152 mg/dL — ABNORMAL HIGH (ref 70–99)
Glucose-Capillary: 190 mg/dL — ABNORMAL HIGH (ref 70–99)

## 2023-01-21 SURGERY — EXCISION LIPOMA
Anesthesia: Monitor Anesthesia Care | Site: Scalp

## 2023-01-21 MED ORDER — SODIUM CHLORIDE 0.9% FLUSH: 3.0000 mL | INTRAVENOUS | Status: DC | PRN

## 2023-01-21 MED ORDER — SODIUM CHLORIDE 0.9 % IV SOLN: 250.0000 mL | INTRAVENOUS | Status: DC | PRN

## 2023-01-21 MED ORDER — LIDOCAINE HCL (PF) 2 % IJ SOLN
INTRAMUSCULAR | Status: DC | PRN
  Administered 2023-01-21: 60 mg via INTRADERMAL

## 2023-01-21 MED ORDER — PROPOFOL 500 MG/50ML IV EMUL
INTRAVENOUS | Status: DC | PRN
  Administered 2023-01-21: 80 ug/kg/min via INTRAVENOUS

## 2023-01-21 MED ORDER — 0.9 % SODIUM CHLORIDE (POUR BTL) OPTIME
TOPICAL | Status: DC | PRN
  Administered 2023-01-21: 1000 mL

## 2023-01-21 MED ORDER — ONDANSETRON HCL 4 MG/2ML IJ SOLN
INTRAMUSCULAR | Status: DC | PRN
  Administered 2023-01-21: 4 mg via INTRAVENOUS

## 2023-01-21 MED ORDER — MIDAZOLAM HCL 5 MG/5ML IJ SOLN
INTRAMUSCULAR | Status: DC | PRN
  Administered 2023-01-21: 2 mg via INTRAVENOUS

## 2023-01-21 MED ORDER — PROMETHAZINE HCL 25 MG/ML IJ SOLN: 6.2500 mg | INTRAMUSCULAR | Status: DC | PRN

## 2023-01-21 MED ORDER — FENTANYL CITRATE (PF) 100 MCG/2ML IJ SOLN
INTRAMUSCULAR | Status: AC
  Filled 2023-01-21: qty 2

## 2023-01-21 MED ORDER — LIDOCAINE-EPINEPHRINE 1 %-1:100000 IJ SOLN
INTRAMUSCULAR | Status: AC
  Filled 2023-01-21: qty 1

## 2023-01-21 MED ORDER — ACETAMINOPHEN 500 MG PO TABS
1000.0000 mg | ORAL_TABLET | ORAL | Status: AC
  Administered 2023-01-21: 1000 mg via ORAL
  Filled 2023-01-21: qty 2

## 2023-01-21 MED ORDER — CEFAZOLIN SODIUM-DEXTROSE 2-4 GM/100ML-% IV SOLN
2.0000 g | INTRAVENOUS | Status: AC
  Administered 2023-01-21: 2 g via INTRAVENOUS
  Filled 2023-01-21: qty 100

## 2023-01-21 MED ORDER — PROPOFOL 1000 MG/100ML IV EMUL
INTRAVENOUS | Status: AC
  Filled 2023-01-21: qty 100

## 2023-01-21 MED ORDER — DEXAMETHASONE SODIUM PHOSPHATE 10 MG/ML IJ SOLN
INTRAMUSCULAR | Status: AC
  Filled 2023-01-21: qty 1

## 2023-01-21 MED ORDER — ORAL CARE MOUTH RINSE: 15.0000 mL | Freq: Once | OROMUCOSAL | Status: AC

## 2023-01-21 MED ORDER — LIDOCAINE HCL (PF) 2 % IJ SOLN
INTRAMUSCULAR | Status: AC
  Filled 2023-01-21: qty 5

## 2023-01-21 MED ORDER — ACETAMINOPHEN 325 MG PO TABS: 650.0000 mg | ORAL_TABLET | ORAL | Status: DC | PRN

## 2023-01-21 MED ORDER — FENTANYL CITRATE PF 50 MCG/ML IJ SOSY: 25.0000 ug | PREFILLED_SYRINGE | INTRAMUSCULAR | Status: DC | PRN

## 2023-01-21 MED ORDER — LACTATED RINGERS IV SOLN: INTRAVENOUS | Status: DC

## 2023-01-21 MED ORDER — DEXAMETHASONE SODIUM PHOSPHATE 4 MG/ML IJ SOLN
INTRAMUSCULAR | Status: DC | PRN
  Administered 2023-01-21: 5 mg via INTRAVENOUS

## 2023-01-21 MED ORDER — FENTANYL CITRATE (PF) 100 MCG/2ML IJ SOLN
INTRAMUSCULAR | Status: DC | PRN
  Administered 2023-01-21: 50 ug via INTRAVENOUS

## 2023-01-21 MED ORDER — OXYCODONE HCL 5 MG PO TABS: 5.0000 mg | ORAL_TABLET | ORAL | Status: DC | PRN

## 2023-01-21 MED ORDER — CHLORHEXIDINE GLUCONATE 0.12 % MT SOLN
15.0000 mL | Freq: Once | OROMUCOSAL | Status: AC
  Administered 2023-01-21: 15 mL via OROMUCOSAL

## 2023-01-21 MED ORDER — CHLORHEXIDINE GLUCONATE 4 % EX LIQD: 60.0000 mL | Freq: Once | CUTANEOUS | Status: DC

## 2023-01-21 MED ORDER — TRAMADOL HCL 50 MG PO TABS: 50.0000 mg | ORAL_TABLET | Freq: Four times a day (QID) | ORAL | 0 refills | Status: AC | PRN

## 2023-01-21 MED ORDER — ACETAMINOPHEN 650 MG RE SUPP: 650.0000 mg | RECTAL | Status: DC | PRN

## 2023-01-21 MED ORDER — MIDAZOLAM HCL 2 MG/2ML IJ SOLN
INTRAMUSCULAR | Status: AC
  Filled 2023-01-21: qty 2

## 2023-01-21 MED ORDER — ONDANSETRON HCL 4 MG/2ML IJ SOLN
INTRAMUSCULAR | Status: AC
  Filled 2023-01-21: qty 2

## 2023-01-21 MED ORDER — PROPOFOL 10 MG/ML IV BOLUS
INTRAVENOUS | Status: DC | PRN
  Administered 2023-01-21: 60 mg via INTRAVENOUS

## 2023-01-21 MED ORDER — SODIUM CHLORIDE 0.9% FLUSH: 3.0000 mL | Freq: Two times a day (BID) | INTRAVENOUS | Status: DC

## 2023-01-21 MED ORDER — LIDOCAINE-EPINEPHRINE 1 %-1:100000 IJ SOLN
INTRAMUSCULAR | Status: DC | PRN
  Administered 2023-01-21: 10 mL

## 2023-01-21 SURGICAL SUPPLY — 30 items
BAG COUNTER SPONGE SURGICOUNT (BAG) IMPLANT
COVER SURGICAL LIGHT HANDLE (MISCELLANEOUS) ×1 IMPLANT
DERMABOND ADVANCED .7 DNX12 (GAUZE/BANDAGES/DRESSINGS) IMPLANT
DRAPE LAPAROSCOPIC ABDOMINAL (DRAPES) IMPLANT
DRAPE LAPAROTOMY T 102X78X121 (DRAPES) IMPLANT
DRAPE LAPAROTOMY TRNSV 102X78 (DRAPES) IMPLANT
DRAPE UTILITY XL STRL (DRAPES) ×1 IMPLANT
ELECT REM PT RETURN 15FT ADLT (MISCELLANEOUS) ×1 IMPLANT
GAUZE SPONGE 4X4 12PLY STRL (GAUZE/BANDAGES/DRESSINGS) ×1 IMPLANT
GLOVE BIO SURGEON STRL SZ 6 (GLOVE) ×1 IMPLANT
GLOVE INDICATOR 6.5 STRL GRN (GLOVE) ×1 IMPLANT
GOWN STRL REUS W/ TWL LRG LVL3 (GOWN DISPOSABLE) ×1 IMPLANT
GOWN STRL REUS W/ TWL XL LVL3 (GOWN DISPOSABLE) IMPLANT
GOWN STRL REUS W/TWL LRG LVL3 (GOWN DISPOSABLE) ×1
GOWN STRL REUS W/TWL XL LVL3 (GOWN DISPOSABLE)
KIT BASIN OR (CUSTOM PROCEDURE TRAY) ×1 IMPLANT
KIT TURNOVER KIT A (KITS) IMPLANT
MARKER SKIN DUAL TIP RULER LAB (MISCELLANEOUS) IMPLANT
NDL HYPO 25X1 1.5 SAFETY (NEEDLE) IMPLANT
NEEDLE HYPO 22GX1.5 SAFETY (NEEDLE) ×1 IMPLANT
NEEDLE HYPO 25X1 1.5 SAFETY (NEEDLE) ×1 IMPLANT
PACK GENERAL/GYN (CUSTOM PROCEDURE TRAY) ×1 IMPLANT
SPIKE FLUID TRANSFER (MISCELLANEOUS) IMPLANT
STAPLER VISISTAT 35W (STAPLE) IMPLANT
SUT MNCRL AB 4-0 PS2 18 (SUTURE) ×1 IMPLANT
SUT VIC AB 3-0 SH 27 (SUTURE) ×1
SUT VIC AB 3-0 SH 27XBRD (SUTURE) ×1 IMPLANT
SYR CONTROL 10ML LL (SYRINGE) ×1 IMPLANT
TOWEL OR 17X26 10 PK STRL BLUE (TOWEL DISPOSABLE) ×1 IMPLANT
TOWEL OR NON WOVEN STRL DISP B (DISPOSABLE) ×1 IMPLANT

## 2023-01-21 NOTE — Interval H&P Note (Signed)
History and Physical Interval Note:  01/21/2023 7:27 AM  Kenneth Durham  has presented today for surgery, with the diagnosis of SCALP MASS, LIPOMA.  The various methods of treatment have been discussed with the patient and family. After consideration of risks, benefits and other options for treatment, the patient has consented to  Procedure(s): EXCISION OF SCALP MASS (N/A) as a surgical intervention.  The patient's history has been reviewed, patient examined, no change in status, stable for surgery.  I have reviewed the patient's chart and labs.  Questions were answered to the patient's satisfaction.     Mikle Sternberg Rich Brave

## 2023-01-21 NOTE — Op Note (Signed)
Operative Note  ANASTASIOS MELANDER  [id number]  [id number]  01/21/2023   Surgeon: Romana Juniper MD FACS   Procedure performed: Excision of subcutaneous mass, 1.5 x 2 cm, scalp   Preop diagnosis: Scalp mass Post-op diagnosis/intraop findings: Likely lipoma   Specimens: Scalp mass Retained items: no  EBL: Minimal cc Complications: none   Description of procedure: After obtaining informed consent the patient was taken to the operating room and placed supine on the operating room table where MAC was initiated, preoperative antibiotics were administered, SCDs applied, and a formal timeout was performed.  The scalp was prepped in usual sterile fashion and then after infiltration with local, an incision was made over the palpable subcutaneous mass.  Soft tissues were dissected with cautery until the surface of the mass was encountered.  The mass was then dissected free of its thin attachments to the surrounding soft tissue and underlying fascia.  This was handed off for pathology.  Hemostasis was ensured within the wound.  The incision was closed with interrupted deep dermal 3-0 Vicryl followed by running subcuticular 4-0 Monocryl and Dermabond. The patient was then awakened and taken to PACU in stable condition.    All counts were correct at the completion of the case.

## 2023-01-21 NOTE — Discharge Instructions (Addendum)
GENERAL SURGERY: POST OP INSTRUCTIONS  EAT Gradually transition to a high fiber diet with a fiber supplement over the next few weeks after discharge.  Start with a pureed / full liquid diet (see below)  WALK Walk an hour a day (cumulative, not all at once).  Control your pain to do that.    CONTROL PAIN Control pain so that you can walk, sleep, tolerate sneezing/coughing, go up/down stairs.  HAVE A BOWEL MOVEMENT DAILY Keep your bowels regular to avoid problems.  OK to try a laxative to override constipation.  OK to use an antidairrheal to slow down diarrhea.  Call if not better after 2 tries  CALL IF YOU HAVE PROBLEMS/CONCERNS Call if you are still struggling despite following these instructions. Call if you have concerns not answered by these instructions    DIET: Follow a light bland diet & liquids the first 24 hours after arrival home, such as soup, liquids, starches, etc.  Be sure to drink plenty of fluids.  Quickly advance to a usual solid diet within a few days.  Avoid fast food or heavy meals as your are more likely to get nauseated or have irregular bowels.  A low-sugar, high-fiber diet for the rest of your life is ideal.   Take your usually prescribed home medications unless otherwise directed. PAIN CONTROL: Pain is best controlled by a usual combination of three different methods TOGETHER: Ice/Heat Over the counter pain medication Prescription pain medication Most patients will experience some swelling and bruising around the incisions.  Ice packs or heating pads (30-60 minutes up to 6 times a day) will help. Use ice for the first few days to help decrease swelling and bruising, then switch to heat to help relax tight/sore spots and speed recovery.  Some people prefer to use ice alone, heat alone, alternating between ice & heat.  Experiment to what works for you.  Swelling and bruising can take several weeks to resolve.   It is helpful to take an over-the-counter pain  medication regularly for the first few weeks.  Choose one of the following that works best for you: Naproxen (Aleve, etc)  Two 220mg  tabs twice a day OR Ibuprofen (Advil, etc) Three 200mg  tabs four times a day (every meal & bedtime) AND Acetaminophen (Tylenol, etc) 500-650mg  four times a day (every meal & bedtime) A  prescription for pain medication (such as oxycodone, hydrocodone, etc) should be given to you upon discharge.  Take your pain medication as prescribed.  If you are having problems/concerns with the prescription medicine (does not control pain, nausea, vomiting, rash, itching, etc), please call us [phone] to see if we need to switch you to a different pain medicine that will work better for you and/or control your side effect better. If you need a refill on your pain medication, please contact your pharmacy.  They will contact our office to request authorization. Prescriptions will not be filled after 5 pm or on week-ends. Avoid getting constipated.  Between the surgery and the pain medications, it is common to experience some constipation.  Increasing fluid intake and taking a fiber supplement (such as Metamucil, Citrucel, FiberCon, MiraLax, etc) 1-2 times a day regularly will usually help prevent this problem from occurring.  A mild laxative (prune juice, Milk of Magnesia, MiraLax, etc) should be taken according to package directions if there are no bowel movements after 48 hours.   Wash / shower every day, starting 2 days after surgery.  You may shower over the  skin glue which is waterproof.  Continue to shower over incision(s) after the dressing is off. Glue will flake off after 1-2 weeks.  You may leave the incision open to air.  You may replace a dressing/Band-Aid to cover the incision for comfort if you wish.   ACTIVITIES as tolerated:   You may resume regular (light) daily activities beginning the next day--such as daily self-care, walking, climbing stairs--gradually increasing  activities as tolerated.  If you can walk 30 minutes without difficulty, it is safe to try more intense activity such as jogging, treadmill, bicycling, low-impact aerobics, swimming, etc. Save the most intensive and strenuous activity for last such as sit-ups, heavy lifting, contact sports, etc  Refrain from any heavy lifting or straining until you are off narcotics for pain control.   DO NOT PUSH THROUGH PAIN.  Let pain be your guide: If it hurts to do something, don't do it.  Pain is your body warning you to avoid that activity for another week until the pain goes down. You may drive when you are no longer taking prescription pain medication, you can comfortably wear a seatbelt, and you can safely maneuver your car and apply brakes. You may have sexual intercourse when it is comfortable.  FOLLOW UP in our office Please call [facility] at [phone] to set up an appointment to see your surgeon in the office for a follow-up appointment approximately 2-3 weeks after your surgery. Make sure that you call for this appointment the day you arrive home to insure a convenient appointment time. 9. IF YOU HAVE DISABILITY OR FAMILY LEAVE FORMS, BRING THEM TO THE OFFICE FOR PROCESSING.  DO NOT GIVE THEM TO YOUR DOCTOR.   WHEN TO CALL us [phone]: Poor pain control Reactions / problems with new medications (rash/itching, nausea, etc)  Fever over 101.5 F (38.5 C) Worsening swelling or bruising Continued bleeding from incision. Increased pain, redness, or drainage from the incision Difficulty breathing / swallowing   The clinic staff is available to answer your questions during regular business hours (8:30am-5pm).  Please don't hesitate to call and ask to speak to one of our nurses for clinical concerns.   If you have a medical emergency, go to the nearest emergency room or call 911.  A surgeon from [facility] is always on call at the [facility], [address], [place], [place], [place]  [address] ? MAIN: [phone] ? TOLL FREE: [phone] ?  FAX [phone] [email or web address]

## 2023-01-21 NOTE — Anesthesia Postprocedure Evaluation (Signed)
Anesthesia Post Note  Patient: Kenneth Durham  Procedure(s) Performed: EXCISION OF SCALP MASS (Scalp)     Patient location during evaluation: PACU Anesthesia Type: MAC Level of consciousness: awake and alert Pain management: pain level controlled Vital Signs Assessment: post-procedure vital signs reviewed and stable Respiratory status: spontaneous breathing, nonlabored ventilation, respiratory function stable and patient connected to nasal cannula oxygen Cardiovascular status: stable and blood pressure returned to baseline Postop Assessment: no apparent nausea or vomiting Anesthetic complications: no   No notable events documented.  Last Vitals:  Vitals:   01/21/23 1130 01/21/23 1156  BP: (!) 144/99 (!) 124/93  Pulse: 70 72  Resp: 16 18  Temp: 36.7 C 36.7 C  SpO2: 100% 100%    Last Pain:  Vitals:   01/21/23 1156  TempSrc: Oral  PainSc: 0-No pain                 Santa Lighter

## 2023-01-21 NOTE — Anesthesia Preprocedure Evaluation (Addendum)
Anesthesia Evaluation  Patient identified by MRN, date of birth, ID band Patient awake    Reviewed: Allergy & Precautions, NPO status , Patient's Chart, lab work & pertinent test results  Airway Mallampati: II  TM Distance: >3 FB Neck ROM: Full    Dental  (+) Teeth Intact, Dental Advisory Given   Pulmonary former smoker   Pulmonary exam normal breath sounds clear to auscultation       Cardiovascular hypertension, + Peripheral Vascular Disease  Normal cardiovascular exam Rhythm:Regular Rate:Normal     Neuro/Psych  Headaches    GI/Hepatic negative GI ROS, Neg liver ROS,,,  Endo/Other  diabetes, Type 2, Insulin Dependent    Renal/GU negative Renal ROS     Musculoskeletal SCALP MASS, LIPOMA   Abdominal   Peds  Hematology negative hematology ROS (+)   Anesthesia Other Findings Day of surgery medications reviewed with the patient.  Reproductive/Obstetrics                             Anesthesia Physical Anesthesia Plan  ASA: 2  Anesthesia Plan: MAC   Post-op Pain Management: Tylenol PO (pre-op)*   Induction: Intravenous  PONV Risk Score and Plan: 2 and Midazolam, Dexamethasone, Ondansetron and TIVA  Airway Management Planned: Natural Airway and Simple Face Mask  Additional Equipment:   Intra-op Plan:   Post-operative Plan:   Informed Consent: I have reviewed the patients History and Physical, chart, labs and discussed the procedure including the risks, benefits and alternatives for the proposed anesthesia with the patient or authorized representative who has indicated his/her understanding and acceptance.     Dental advisory given  Plan Discussed with: CRNA  Anesthesia Plan Comments:        Anesthesia Quick Evaluation

## 2023-01-21 NOTE — Transfer of Care (Signed)
Immediate Anesthesia Transfer of Care Note  Patient: Kenneth Durham  Procedure(s) Performed: EXCISION OF SCALP MASS (Scalp)  Patient Location: PACU  Anesthesia Type:MAC  Level of Consciousness: awake, alert , oriented, and patient cooperative  Airway & Oxygen Therapy: Patient Spontanous Breathing and Patient connected to face mask oxygen  Post-op Assessment: Report given to RN, Post -op Vital signs reviewed and stable, and Patient moving all extremities  Post vital signs: Reviewed and stable  Last Vitals:  Vitals Value Taken Time  BP 117/66 01/21/23 1034  Temp    Pulse 67 01/21/23 1035  Resp 15 01/21/23 1035  SpO2 99 % 01/21/23 1035  Vitals shown include unvalidated device data.  Last Pain:  Vitals:   01/21/23 0745  TempSrc: Oral  PainSc:          Complications: No notable events documented.

## 2023-01-22 ENCOUNTER — Encounter (HOSPITAL_COMMUNITY): Payer: Self-pay | Admitting: Surgery

## 2023-01-24 LAB — SURGICAL PATHOLOGY

## 2023-04-08 ENCOUNTER — Other Ambulatory Visit (HOSPITAL_COMMUNITY): Payer: Self-pay

## 2023-04-08 MED ORDER — MOUNJARO 10 MG/0.5ML ~~LOC~~ SOAJ
10.0000 mg | SUBCUTANEOUS | 3 refills | Status: DC
  Filled 2023-04-08 (×2): qty 2, 28d supply, fill #0
  Filled 2023-05-02: qty 2, 28d supply, fill #1

## 2023-05-02 ENCOUNTER — Other Ambulatory Visit (HOSPITAL_COMMUNITY): Payer: Self-pay

## 2023-05-05 ENCOUNTER — Other Ambulatory Visit (HOSPITAL_COMMUNITY): Payer: Self-pay

## 2023-05-09 ENCOUNTER — Other Ambulatory Visit (HOSPITAL_COMMUNITY): Payer: Self-pay

## 2024-08-17 ENCOUNTER — Encounter: Payer: Self-pay | Admitting: Podiatry

## 2024-08-17 ENCOUNTER — Ambulatory Visit (INDEPENDENT_AMBULATORY_CARE_PROVIDER_SITE_OTHER): Admitting: Podiatry

## 2024-08-17 VITALS — Ht 72.0 in | Wt 198.0 lb

## 2024-08-17 DIAGNOSIS — M79675 Pain in left toe(s): Secondary | ICD-10-CM | POA: Diagnosis not present

## 2024-08-17 DIAGNOSIS — B351 Tinea unguium: Secondary | ICD-10-CM | POA: Diagnosis not present

## 2024-08-17 DIAGNOSIS — E1140 Type 2 diabetes mellitus with diabetic neuropathy, unspecified: Secondary | ICD-10-CM

## 2024-08-17 DIAGNOSIS — E1149 Type 2 diabetes mellitus with other diabetic neurological complication: Secondary | ICD-10-CM | POA: Diagnosis not present

## 2024-08-17 DIAGNOSIS — M79674 Pain in right toe(s): Secondary | ICD-10-CM | POA: Diagnosis not present

## 2024-08-17 NOTE — Progress Notes (Signed)
 Subjective:   Patient ID: Kenneth Durham, male   DOB: 58 y.o.   MRN: [id number]   HPI Patient presents for chronic nail disease 1-5 both feet long-term diabetes with relative poor control with last A1c being 10.7 and he admits he is motivated to try to improve this does not understand insulin  properly and has not been seen by endocrinologist   Review of Systems  All other systems reviewed and are negative.       Objective:  Physical Exam Vitals and nursing note reviewed.  Constitutional:      Appearance: He is well-developed.  Pulmonary:     Effort: Pulmonary effort is normal.  Musculoskeletal:        General: Normal range of motion.  Skin:    General: Skin is warm.  Neurological:     Mental Status: He is alert.     Vascular status mildly diminished intact with diminishment of sharp dull and vibratory bilateral.  Patient is noted to have thickened nailbeds 1-5 both feet that are dystrophic and painful and diabetes again which is in poor control no ulcerations noted no open sores are noted     Assessment:  Patient who has diabetes that I think is not in the type of control it should be despite weight control with thick mycotic nail infection 1-5 both feet     Plan:  H&P reviewed and I do think that he should see an endocrinologist and his family doctor is going to refer him.  I debrided nailbeds 1-5 both feet and had long discussion with him concerning diabetes the importance of control and patient will be seen back to recheck all questions answered today and also discussed diabetic neuropathy and possible reduced vascular flow

## 2024-10-19 LAB — OPHTHALMOLOGY REPORT-SCANNED

## 2024-11-19 ENCOUNTER — Encounter: Payer: Self-pay | Admitting: Podiatry

## 2024-11-19 ENCOUNTER — Ambulatory Visit: Admitting: Podiatry

## 2024-11-19 DIAGNOSIS — B351 Tinea unguium: Secondary | ICD-10-CM

## 2024-11-19 DIAGNOSIS — E1140 Type 2 diabetes mellitus with diabetic neuropathy, unspecified: Secondary | ICD-10-CM | POA: Diagnosis not present

## 2024-11-19 DIAGNOSIS — M79674 Pain in right toe(s): Secondary | ICD-10-CM

## 2024-11-19 DIAGNOSIS — E1149 Type 2 diabetes mellitus with other diabetic neurological complication: Secondary | ICD-10-CM

## 2024-11-19 DIAGNOSIS — M79675 Pain in left toe(s): Secondary | ICD-10-CM

## 2024-11-19 NOTE — Progress Notes (Signed)
 Subjective:   Patient ID: Kenneth Durham, male   DOB: 57 y.o.   MRN: [id number]   HPI Long-term diabetic presents with significant elongation nailbeds 1-5 both feet thick painful with inability to cut   ROS      Objective:  Physical Exam  Neurovascular status moderately reduction of sharp dull vibratory with thick yellow brittle nailbeds 1-5 both feet painful     Assessment:  Chronic mycotic nail infection 1-5 both feet with pain diabetic neuropathy risk to this patient     Plan:  H&P done encourage continued good care of diabetes debridement nailbeds 1-5 both feet Neutra genic bleeding reappoint routine care

## 2025-03-20 ENCOUNTER — Ambulatory Visit: Admitting: Podiatry
